# Patient Record
Sex: Male | Born: 1975
Health system: Southern US, Community
[De-identification: ages and names within clinical notes are randomized; demographics above are authoritative.]

## PROBLEM LIST (undated history)

## (undated) DIAGNOSIS — K219 Gastro-esophageal reflux disease without esophagitis: Secondary | ICD-10-CM

## (undated) DIAGNOSIS — S0990XA Unspecified injury of head, initial encounter: Secondary | ICD-10-CM

## (undated) DIAGNOSIS — F1910 Other psychoactive substance abuse, uncomplicated: Secondary | ICD-10-CM

## (undated) DIAGNOSIS — M47817 Spondylosis without myelopathy or radiculopathy, lumbosacral region: Secondary | ICD-10-CM

## (undated) DIAGNOSIS — F1411 Cocaine abuse, in remission: Secondary | ICD-10-CM

## (undated) DIAGNOSIS — G8929 Other chronic pain: Secondary | ICD-10-CM

## (undated) DIAGNOSIS — R51 Headache: Secondary | ICD-10-CM

## (undated) DIAGNOSIS — G43909 Migraine, unspecified, not intractable, without status migrainosus: Secondary | ICD-10-CM

## (undated) DIAGNOSIS — M1990 Unspecified osteoarthritis, unspecified site: Secondary | ICD-10-CM

## (undated) DIAGNOSIS — J309 Allergic rhinitis, unspecified: Secondary | ICD-10-CM

## (undated) DIAGNOSIS — J45909 Unspecified asthma, uncomplicated: Secondary | ICD-10-CM

## (undated) DIAGNOSIS — G5600 Carpal tunnel syndrome, unspecified upper limb: Secondary | ICD-10-CM

## (undated) DIAGNOSIS — M47812 Spondylosis without myelopathy or radiculopathy, cervical region: Secondary | ICD-10-CM

## (undated) DIAGNOSIS — M545 Low back pain, unspecified: Secondary | ICD-10-CM

## (undated) DIAGNOSIS — R519 Headache, unspecified: Secondary | ICD-10-CM

## (undated) DIAGNOSIS — E039 Hypothyroidism, unspecified: Secondary | ICD-10-CM

## (undated) DIAGNOSIS — M542 Cervicalgia: Secondary | ICD-10-CM

## (undated) DIAGNOSIS — S48912A Complete traumatic amputation of left shoulder and upper arm, level unspecified, initial encounter: Secondary | ICD-10-CM

## (undated) DIAGNOSIS — M75100 Unspecified rotator cuff tear or rupture of unspecified shoulder, not specified as traumatic: Secondary | ICD-10-CM

## (undated) HISTORY — DX: Headache, unspecified: R51.9

## (undated) HISTORY — DX: Other psychoactive substance abuse, uncomplicated: F19.10

## (undated) HISTORY — PX: ARM AMPUTATION: SUR21

## (undated) HISTORY — DX: Unspecified osteoarthritis, unspecified site: M19.90

## (undated) HISTORY — PX: PELVIC FRACTURE SURGERY: SHX119

## (undated) HISTORY — DX: Cocaine abuse, in remission: F14.11

## (undated) HISTORY — DX: Spondylosis without myelopathy or radiculopathy, lumbosacral region: M47.817

## (undated) HISTORY — DX: Low back pain, unspecified: M54.50

## (undated) HISTORY — DX: Unspecified asthma, uncomplicated: J45.909

## (undated) HISTORY — PX: OTHER SURGICAL HISTORY: SHX169

## (undated) HISTORY — DX: Migraine, unspecified, not intractable, without status migrainosus: G43.909

## (undated) HISTORY — DX: Allergic rhinitis, unspecified: J30.9

## (undated) HISTORY — DX: Hypothyroidism, unspecified: E03.9

## (undated) HISTORY — DX: Spondylosis without myelopathy or radiculopathy, cervical region: M47.812

## (undated) HISTORY — DX: Other chronic pain: G89.29

## (undated) HISTORY — DX: Headache: R51

## (undated) HISTORY — PX: WISDOM TOOTH EXTRACTION: SHX21

## (undated) HISTORY — DX: Cervicalgia: M54.2

## (undated) HISTORY — DX: Gastro-esophageal reflux disease without esophagitis: K21.9

## (undated) HISTORY — DX: Complete traumatic amputation of left shoulder and upper arm, level unspecified, initial encounter: S48.912A

## (undated) HISTORY — DX: Unspecified injury of head, initial encounter: S09.90XA

## (undated) HISTORY — DX: Low back pain: M54.5

## (undated) HISTORY — DX: Carpal tunnel syndrome, unspecified upper limb: G56.00

## (undated) HISTORY — DX: Unspecified rotator cuff tear or rupture of unspecified shoulder, not specified as traumatic: M75.100

---

## 2000-04-07 DIAGNOSIS — M75100 Unspecified rotator cuff tear or rupture of unspecified shoulder, not specified as traumatic: Secondary | ICD-10-CM

## 2000-04-07 HISTORY — DX: Unspecified rotator cuff tear or rupture of unspecified shoulder, not specified as traumatic: M75.100

## 2005-01-20 ENCOUNTER — Ambulatory Visit: Payer: Self-pay | Admitting: Physician Assistant

## 2007-07-28 ENCOUNTER — Ambulatory Visit: Payer: Self-pay | Admitting: Pain Medicine

## 2007-11-01 ENCOUNTER — Ambulatory Visit: Payer: Self-pay | Admitting: Gastroenterology

## 2010-04-24 ENCOUNTER — Ambulatory Visit: Payer: Self-pay | Admitting: Pain Medicine

## 2012-02-23 ENCOUNTER — Inpatient Hospital Stay: Payer: Self-pay | Admitting: Internal Medicine

## 2012-02-23 LAB — COMPREHENSIVE METABOLIC PANEL
Albumin: 2.8 g/dL — ABNORMAL LOW (ref 3.4–5.0)
Alkaline Phosphatase: 132 U/L (ref 50–136)
BUN: 11 mg/dL (ref 7–18)
Calcium, Total: 9.2 mg/dL (ref 8.5–10.1)
Chloride: 100 mmol/L (ref 98–107)
Co2: 26 mmol/L (ref 21–32)
EGFR (African American): >60
EGFR (Non-African Amer.): >60
Glucose: 129 mg/dL — ABNORMAL HIGH (ref 65–99)
Osmolality: 269 (ref 275–301)
Potassium: 3.4 mmol/L — ABNORMAL LOW (ref 3.5–5.1)
SGPT (ALT): 27 U/L (ref 12–78)
Sodium: 134 mmol/L — ABNORMAL LOW (ref 136–145)

## 2012-02-23 LAB — CBC
HGB: 12.2 g/dL — ABNORMAL LOW (ref 13.0–18.0)
Platelet: 267 10*3/uL (ref 150–440)
RBC: 3.96 10*6/uL — ABNORMAL LOW (ref 4.40–5.90)
WBC: 13.7 10*3/uL — ABNORMAL HIGH (ref 3.8–10.6)

## 2012-02-23 LAB — CK: CK, Total: 57 U/L (ref 35–232)

## 2012-02-25 LAB — COMPREHENSIVE METABOLIC PANEL
Albumin: 2 g/dL — ABNORMAL LOW (ref 3.4–5.0)
Alkaline Phosphatase: 466 U/L — ABNORMAL HIGH (ref 50–136)
Anion Gap: 6 — ABNORMAL LOW (ref 7–16)
Calcium, Total: 8.1 mg/dL — ABNORMAL LOW (ref 8.5–10.1)
Creatinine: 0.82 mg/dL (ref 0.60–1.30)
EGFR (Non-African Amer.): >60
Glucose: 102 mg/dL — ABNORMAL HIGH (ref 65–99)
Potassium: 3.3 mmol/L — ABNORMAL LOW (ref 3.5–5.1)
SGPT (ALT): 23 U/L (ref 12–78)
Sodium: 137 mmol/L (ref 136–145)
Total Protein: 5.7 g/dL — ABNORMAL LOW (ref 6.4–8.2)

## 2012-02-25 LAB — CBC WITH DIFFERENTIAL/PLATELET
Basophil #: 0.1 10*3/uL (ref 0.0–0.1)
Basophil %: 0.5 %
Eosinophil #: 0.3 10*3/uL (ref 0.0–0.7)
Eosinophil %: 2.2 %
HCT: 27.5 % — ABNORMAL LOW (ref 40.0–52.0)
HGB: 9.4 g/dL — ABNORMAL LOW (ref 13.0–18.0)
Lymphocyte #: 1.1 10*3/uL (ref 1.0–3.6)
Lymphocyte %: 9.2 %
MCHC: 34.3 g/dL (ref 32.0–36.0)
Monocyte #: 1.3 x10 3/mm — ABNORMAL HIGH (ref 0.2–1.0)
Neutrophil %: 77.1 %
Platelet: 248 10*3/uL (ref 150–440)
RDW: 14.3 % (ref 11.5–14.5)
WBC: 11.6 10*3/uL — ABNORMAL HIGH (ref 3.8–10.6)

## 2012-02-25 LAB — VANCOMYCIN, TROUGH: Vancomycin, Trough: 14 ug/mL (ref 10–20)

## 2012-02-27 LAB — WOUND CULTURE

## 2012-02-28 LAB — CBC WITH DIFFERENTIAL/PLATELET
Basophil #: 0.1 10*3/uL (ref 0.0–0.1)
Basophil %: 0.7 %
Eosinophil #: 0.3 10*3/uL (ref 0.0–0.7)
HCT: 32.1 % — ABNORMAL LOW (ref 40.0–52.0)
HGB: 10.5 g/dL — ABNORMAL LOW (ref 13.0–18.0)
Lymphocyte #: 1.9 10*3/uL (ref 1.0–3.6)
Lymphocyte %: 12.6 %
MCH: 29.4 pg (ref 26.0–34.0)
MCHC: 32.6 g/dL (ref 32.0–36.0)
MCV: 90 fL (ref 80–100)
Neutrophil #: 11.3 10*3/uL — ABNORMAL HIGH (ref 1.4–6.5)
Neutrophil %: 76.7 %
RDW: 15 % — ABNORMAL HIGH (ref 11.5–14.5)

## 2012-02-28 LAB — BASIC METABOLIC PANEL
Anion Gap: 8 (ref 7–16)
BUN: 10 mg/dL (ref 7–18)
Co2: 26 mmol/L (ref 21–32)
EGFR (Non-African Amer.): >60
Glucose: 120 mg/dL — ABNORMAL HIGH (ref 65–99)
Osmolality: 281 (ref 275–301)

## 2012-02-29 LAB — CBC WITH DIFFERENTIAL/PLATELET
Basophil #: 0.1 10*3/uL (ref 0.0–0.1)
Eosinophil #: 0.3 10*3/uL (ref 0.0–0.7)
Lymphocyte %: 13.3 %
MCHC: 33.3 g/dL (ref 32.0–36.0)
Monocyte %: 5.7 %
Neutrophil #: 11.9 10*3/uL — ABNORMAL HIGH (ref 1.4–6.5)
Neutrophil %: 78 %
RDW: 15.2 % — ABNORMAL HIGH (ref 11.5–14.5)
WBC: 15.3 10*3/uL — ABNORMAL HIGH (ref 3.8–10.6)

## 2012-02-29 LAB — BASIC METABOLIC PANEL
BUN: 7 mg/dL (ref 7–18)
Chloride: 107 mmol/L (ref 98–107)
EGFR (African American): >60
EGFR (Non-African Amer.): >60
Glucose: 90 mg/dL (ref 65–99)
Osmolality: 279 (ref 275–301)
Potassium: 4.3 mmol/L (ref 3.5–5.1)

## 2012-02-29 LAB — CULTURE, BLOOD (SINGLE)

## 2012-08-12 DIAGNOSIS — J302 Other seasonal allergic rhinitis: Secondary | ICD-10-CM | POA: Insufficient documentation

## 2013-02-10 DIAGNOSIS — S76311A Strain of muscle, fascia and tendon of the posterior muscle group at thigh level, right thigh, initial encounter: Secondary | ICD-10-CM | POA: Insufficient documentation

## 2013-02-11 DIAGNOSIS — J45909 Unspecified asthma, uncomplicated: Secondary | ICD-10-CM | POA: Insufficient documentation

## 2013-05-11 DIAGNOSIS — E663 Overweight: Secondary | ICD-10-CM | POA: Insufficient documentation

## 2013-08-07 DIAGNOSIS — R892 Abnormal level of other drugs, medicaments and biological substances in specimens from other organs, systems and tissues: Secondary | ICD-10-CM | POA: Insufficient documentation

## 2013-09-08 ENCOUNTER — Encounter (INDEPENDENT_AMBULATORY_CARE_PROVIDER_SITE_OTHER): Payer: Self-pay

## 2013-09-08 ENCOUNTER — Ambulatory Visit (INDEPENDENT_AMBULATORY_CARE_PROVIDER_SITE_OTHER): Payer: Medicare Other | Admitting: Internal Medicine

## 2013-09-08 ENCOUNTER — Encounter: Payer: Self-pay | Admitting: Internal Medicine

## 2013-09-08 VITALS — BP 128/86 | HR 76 | Temp 98.1°F | Ht 68.0 in | Wt 195.5 lb

## 2013-09-08 DIAGNOSIS — Z9109 Other allergy status, other than to drugs and biological substances: Secondary | ICD-10-CM | POA: Insufficient documentation

## 2013-09-08 DIAGNOSIS — G894 Chronic pain syndrome: Secondary | ICD-10-CM

## 2013-09-08 DIAGNOSIS — K219 Gastro-esophageal reflux disease without esophagitis: Secondary | ICD-10-CM | POA: Insufficient documentation

## 2013-09-08 NOTE — Progress Notes (Signed)
HPI  Pt presents to the clinic today to establish care. He has not had a PCP in many years. He was told by his orthopedic surgeon that he needs to get his oxycodone from his PCP.  Flu: 2/15 Tetanus: 2/15 Dentist: yearly  Past Medical History  Diagnosis Date  . GERD (gastroesophageal reflux disease)   . Asthma   . Arthritis   . Frequent headaches     Current Outpatient Prescriptions  Medication Sig Dispense Refill  . albuterol (PROVENTIL HFA;VENTOLIN HFA) 108 (90 BASE) MCG/ACT inhaler Inhale 2 puffs into the lungs every 6 (six) hours as needed for wheezing or shortness of breath.      . Ascorbic Acid (VITAMIN C) 1000 MG tablet Take 1,000 mg by mouth daily.      Marland Kitchen esomeprazole (NEXIUM) 40 MG capsule Take 40 mg by mouth daily at 12 noon.      . Loratadine 10 MG CAPS Take 1 capsule by mouth daily.      . Oxycodone HCl 20 MG TABS Take 1 tablet by mouth 2 (two) times daily.       No current facility-administered medications for this visit.    No Known Allergies  Family History  Problem Relation Age of Onset  . Arthritis Maternal Grandfather   . Cancer Maternal Grandfather     Colon    History   Social History  . Marital Status: Single    Spouse Name: N/A    Number of Children: N/A  . Years of Education: N/A   Occupational History  . Not on file.   Social History Main Topics  . Smoking status: Never Smoker   . Smokeless tobacco: Never Used  . Alcohol Use: No  . Drug Use: Yes  . Sexual Activity: Not on file   Other Topics Concern  . Not on file   Social History Narrative  . No narrative on file    ROS:  Constitutional: Denies fever, malaise, fatigue, headache or abrupt weight changes.  Respiratory: Denies difficulty breathing, shortness of breath, cough or sputum production.   Cardiovascular: Denies chest pain, chest tightness, palpitations or swelling in the hands or feet.  Gastrointestinal: Pt reports reflux. Denies abdominal pain, bloating, constipation,  diarrhea or blood in the stool.  Musculoskeletal: Pt reports chronic pain. .   No other specific complaints in a complete review of systems (except as listed in HPI above).  PE:  BP 128/86  Pulse 76  Temp(Src) 98.1 F (36.7 C) (Oral)  Ht 5\' 8"  (1.727 m)  Wt 195 lb 8 oz (88.678 kg)  BMI 29.73 kg/m2  SpO2 98% Wt Readings from Last 3 Encounters:  09/08/13 195 lb 8 oz (88.678 kg)    General: Appears his stated age, well developed, well nourished in NAD. Cardiovascular: Normal rate and rhythm. S1,S2 noted.  No murmur, rubs or gallops noted. No JVD or BLE edema. No carotid bruits noted. Pulmonary/Chest: Normal effort and positive vesicular breath sounds. No respiratory distress. No wheezes, rales or ronchi noted.  Abdomen: Soft and nontender. Normal bowel sounds, no bruits noted. No distention or masses noted. Liver, spleen and kidneys non palpable.  Assessment and Plan:

## 2013-09-08 NOTE — Assessment & Plan Note (Signed)
On oxycodone Will refer to pain management

## 2013-09-08 NOTE — Assessment & Plan Note (Signed)
Takes claritin daily

## 2013-09-08 NOTE — Patient Instructions (Addendum)
Chronic Back Pain   When back pain lasts longer than 3 months, it is called chronic back pain.People with chronic back pain often go through certain periods that are more intense (flare-ups).   CAUSES  Chronic back pain can be caused by wear and tear (degeneration) on different structures in your back. These structures include:   The bones of your spine (vertebrae) and the joints surrounding your spinal cord and nerve roots (facets).   The strong, fibrous tissues that connect your vertebrae (ligaments).  Degeneration of these structures may result in pressure on your nerves. This can lead to constant pain.  HOME CARE INSTRUCTIONS   Avoid bending, heavy lifting, prolonged sitting, and activities which make the problem worse.   Take brief periods of rest throughout the day to reduce your pain. Lying down or standing usually is better than sitting while you are resting.   Take over-the-counter or prescription medicines only as directed by your caregiver.  SEEK IMMEDIATE MEDICAL CARE IF:    You have weakness or numbness in one of your legs or feet.   You have trouble controlling your bladder or bowels.   You have nausea, vomiting, abdominal pain, shortness of breath, or fainting.  Document Released: 05/01/2004 Document Revised: 06/16/2011 Document Reviewed: 03/08/2011  Exit[employer] Patient Information 2014 [employer].

## 2013-09-08 NOTE — Assessment & Plan Note (Signed)
No issues on nexium 

## 2013-09-08 NOTE — Progress Notes (Signed)
Pre visit review using our clinic review tool, if applicable. No additional management support is needed unless otherwise documented below in the visit note. 

## 2013-10-03 ENCOUNTER — Telehealth: Payer: Self-pay | Admitting: Internal Medicine

## 2013-10-03 NOTE — Telephone Encounter (Signed)
Patient was referred to [facility].  Patient went to the appointment today and they want him to have surgery.  Patient said he doesn't want to have surgery because he has already had 7 surgeries.  They want him to have 3 surgeries in July and 2 in August.  He said he can't use one arm because of the other surgeries.  Patient [facility] - phone #[phone]. Patient want to hear back as soon as possible.

## 2013-10-04 NOTE — Telephone Encounter (Signed)
This if fine with me, do I need to place another referral?

## 2013-10-04 NOTE — Telephone Encounter (Signed)
Shirlee LimerickMarion said yes that would be helpful if you could place another referral

## 2013-10-05 ENCOUNTER — Other Ambulatory Visit: Payer: Self-pay | Admitting: Internal Medicine

## 2013-10-05 DIAGNOSIS — G894 Chronic pain syndrome: Secondary | ICD-10-CM

## 2013-10-05 NOTE — Telephone Encounter (Signed)
Referral placed.

## 2014-05-12 ENCOUNTER — Other Ambulatory Visit: Payer: Self-pay | Admitting: Internal Medicine

## 2014-05-12 ENCOUNTER — Telehealth: Payer: Self-pay | Admitting: Internal Medicine

## 2014-05-12 DIAGNOSIS — G894 Chronic pain syndrome: Secondary | ICD-10-CM

## 2014-05-12 NOTE — Telephone Encounter (Signed)
Referral placed.

## 2014-05-12 NOTE — Telephone Encounter (Signed)
Pt called wanting to get a referral to [facility] in [place].  [facility]  He stated he was not happy with them.  Pt stated he has [employer]n and medicaid.
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
## 2014-07-25 NOTE — Discharge Summary (Signed)
PATIENT NAME:  Melvin Brown, Melvin Brown DATE OF BIRTH:  11/11/1975  DATE OF ADMISSION:  02/23/2012 DATE OF DISCHARGE:  02/29/2012  ADMITTING PHYSICIAN: Marlaine HindAmir Darwish, MD  DISCHARGING PHYSICIAN: Enid Baasadhika Verlinda Slotnick, MD   [facility]  CONSULTATIONS IN THE HOSPITAL: Surgical consultations by Dr. Juliann PulseLundquist and Dr. Michela PitcherEly.   DISCHARGE DIAGNOSES:  1. Left leg cellulitis abscess and abscess on the upper calf area and popliteal fossa, status post incision and drainage and drains put in.  2. Gastroesophageal reflux disease.  3. Hypothyroidism.  4. Chronic obstructive pulmonary disease.  5. Depression and anxiety.   DISCHARGE MEDICATIONS:  1. Singulair 10 mg p.o. daily.  2. Qvar 18 mcg, one spray daily.  3. Vitamin D one tablet  p.o. daily.  4. Flovent 110 mcg inhalation, one puff inhaled as needed.  5. Oxycodone 10 mg every six hours every day.  6. Xanax 0.25 mg at bedtime p.r.n.  7. Diclofenac sodium 75 mg p.o. daily.  8. Vitamin B12, 2500 mcg sublingual tablet once a day.  9. Cymbalta 60 mg p.o. daily.  10. Allegra 180 mg p.o. daily.  11. Levothyroxine 25 mcg p.o. daily.  12. Omeprazole 20 mg p.o. daily.  13. Norco 5/325 mg, one tablet every four hours p.r.n. for pain.  14. Bactrim double strength one tablet p.o. b.i.d. for two weeks.   DRESSING CARE: Replace as needed. Keep dressing dry and may shower. .   FOLLOW-UP INSTRUCTIONS:  1. Follow up with primary care physician in two weeks.  2. Keep left leg elevated.  3. Follow up with Dr. Michela PitcherEly in one week.   LABORATORY, DIAGNOSTIC AND RADIOLOGICAL DATA PRIOR TO DISCHARGE: WBC 15.3, hemoglobin 10.8, hematocrit 32.3, platelet count 489. Sodium 141, potassium 4.3, chloride 107, bicarbonate 26, BUN 7, creatinine 1.1, glucose 19, calcium 8.5. Wound cultures are growing moderate growth of staph aureus. They were sensitive to all antibiotics, so it is methicillin-sensitive Staphylococcus aureus. This is from  02/23/2012. Wound cultures are growing staph aureus from 02/27/2012, but the sensitivities are pending at this time. CT of the tibia and leg showed consistent with cellulitis and phlegmonous changes within the leg at the level of the popliteal fossa medially appreciated. No drainable fluid collection. Questionable necrotizing infectious process cannot be excluded, and muscle edema within the lower extremities seen.  CT of the femur and thigh kind of shows the same findings as mentioned above. Ultrasound Doppler of the lower extremities showed no evidence of deep venous thrombosis. Blood cultures have remained negative.   BRIEF HOSPITAL COURSE: Melvin Brown is a 39 year old male with past medical history significant for depression, anxiety, gastroesophageal reflux disease, chronic right arm pain, presented to the hospital secondary to left leg abscess and cellulitis, not improving. He was not on any outpatient antibiotics. He had I and D done in the ED and was discharged home. He did not take any antibiotics but continued to have fevers, chills, and worsening of the erythema at that site, so he  presented back.    1. Left leg cellulitis and popliteal fossa abscess: Status post I and D in the ED once and then repeat I and D done by Surgical team on 02/27/2012. He has four drains placed, and his erythema and swelling has improved a lot. His cultures are growing MSSA. He was on oxacillin in the hospital and is being discharged on Bactrim. Pain was controlled by IV medications in the hospital and changed to oral medications at the time  of discharge. He should be seeing Dr. Michela Pitcher in the office in the next week to take the drains out.  How to dress the wound was shown to the patient, and he was advised to keep it dry and change the dressing as needed. Surgical consultation is appreciated.  2. Acute renal failure on admission: Likely from infection, with IV fluids improved. Creatinine is normalized.  3. Gastroesophageal  reflux disease: Started on Prilosec.  4. Hypothyroidism: He is on Synthroid.  5. Depression and anxiety: Continue Cymbalta and Xanax p.r.n.  His course has been otherwise uneventful in the hospital.   DISCHARGE CONDITION: Stable.   DISCHARGE DISPOSITION: Home.   TIME SPENT ON DISCHARGE: 45 minutes.   ____________________________ Enid Baas, MD rk:cbb D: 02/29/2012 12:23:43 ET T: 03/01/2012 11:50:39 ET JOB#: [id number]  cc: Enid Baas, MD, <Dictator> [facility] Enid Baas MD ELECTRONICALLY SIGNED 03/04/2012 18:00

## 2014-07-25 NOTE — Op Note (Signed)
PATIENT NAME:  [name], Melvin A MR#:  Brown DATE OF BIRTH:  09-14-1975  DATE OF PROCEDURE:  02/27/2012  PREOPERATIVE DIAGNOSIS: Right leg infection.   POSTOPERATIVE DIAGNOSIS: Right leg infection.  OPERATION: Right leg incision and drainage.  SURGEON: Tiney Rougealph Ely, MD  ANESTHESIA: General.  OPERATIVE PROCEDURE: With the patient in the supine position after induction of appropriate general anesthesia, the patient's right leg was prepped with Betadine and draped with sterile towels. The area of maximum fluctuance was incised and a small amount of purulent material was removed, but probing that area entered a large abscess cavity. Multiple counter incisions were made including the area where it was previously draining. Penrose drain was placed through multiple counter incisions and sutured in place with 3-0 nylon. The area was then irrigated with a large amount of warm saline. Sterile compressive dressing was applied. The patient was returned to the recovery room having tolerated the procedure well. Sponge, instrument, and needle counts were correct x2 in the Operating Room.  ____________________________ Quentin Orealph L. Ely III, MD rle:slb D: 02/27/2012 22:50:30 ET T: 02/28/2012 11:22:34 ET JOB#: [id number]  cc: Quentin Orealph L. Ely III, MD, <Dictator> Quentin OreALPH L ELY MD ELECTRONICALLY SIGNED 02/28/2012 21:30

## 2014-10-12 ENCOUNTER — Encounter: Payer: Self-pay | Admitting: Family Medicine

## 2014-10-12 ENCOUNTER — Ambulatory Visit (INDEPENDENT_AMBULATORY_CARE_PROVIDER_SITE_OTHER): Payer: Medicare Other | Admitting: Family Medicine

## 2014-10-12 VITALS — BP 122/84 | HR 66 | Temp 98.7°F | Ht 67.5 in | Wt 218.2 lb

## 2014-10-12 DIAGNOSIS — M25511 Pain in right shoulder: Secondary | ICD-10-CM | POA: Diagnosis not present

## 2014-10-12 DIAGNOSIS — G894 Chronic pain syndrome: Secondary | ICD-10-CM | POA: Insufficient documentation

## 2014-10-12 DIAGNOSIS — Z13220 Encounter for screening for lipoid disorders: Secondary | ICD-10-CM

## 2014-10-12 DIAGNOSIS — R609 Edema, unspecified: Secondary | ICD-10-CM | POA: Diagnosis not present

## 2014-10-12 DIAGNOSIS — M67919 Unspecified disorder of synovium and tendon, unspecified shoulder: Secondary | ICD-10-CM | POA: Insufficient documentation

## 2014-10-12 LAB — MICROALBUMIN, URINE WAIVED
Creatinine, Urine Waived: 50 mg/dL (ref 10–300)
Microalb, Ur Waived: 10 mg/L (ref 0–19)
Microalb/Creat Ratio: <30 mg/g (ref <30)

## 2014-10-12 NOTE — Progress Notes (Signed)
BP 122/84 mmHg  Pulse 66  Temp(Src) 98.7 F (37.1 C)  Ht 5' 7.5" (1.715 m)  Wt 218 lb 3.2 oz (98.975 kg)  BMI 33.65 kg/m2  SpO2 97%   Subjective:    Patient ID: Melvin Brown, male    DOB: 07-07-1975, 39 y.o.   MRN: [id number]  HPI: BRANDONN Brown is a 39 y.o. male who presents today to establish care. Hasn't seen a doctor in about a year due to a move making it too far to drive.   Chief Complaint  Patient presents with  . Edema    ankle swelling  . Shoulder Pain    right- torn rotator cuff   SHOULDER PAIN- had been diagnosed with a torn rotator cuff. Hasn't done PT for it yet.  Duration: months Involved shoulder: right Mechanism of injury: unknown lifting Location: anterior Onset:sudden Severity: moderate  Quality:  sharp Frequency: constant Radiation: yes into his neck Aggravating factors: lifting  Alleviating factors: ice and rest  Status: worse Treatments attempted: rest, ice, heat, sling, APAP, ibuprofen and aleve  Relief with NSAIDs?:  mild Weakness: yes Numbness: yes Decreased grip strength: yes Redness: no Swelling: no Bruising: no Fevers: no  Edema- have been swelling in his ankles for about 2 weeks. Never swollen before. Worse at night. Has been swollen most of the day. Not there in the morning.   Elevate Blood pressure- never had before. His grandfather had it Duration of elevated blood pressure: unknown BP monitoring frequency:  not checking Aspirin: no Recurrent headaches: yes Visual changes: no Palpitations: no Dyspnea: yes Chest pain: no Lower extremity edema: yes Dizzy/lightheaded: no  Relevant past medical, surgical, family and social history reviewed and updated as indicated. Interim medical history since our last visit reviewed. Allergies and medications reviewed and updated.  Review of Systems  Constitutional: Negative.   Respiratory: Negative.   Cardiovascular: Negative.   Musculoskeletal: Negative.   Neurological: Negative.    Psychiatric/Behavioral: Negative.     Per HPI unless specifically indicated above     Objective:    BP 122/84 mmHg  Pulse 66  Temp(Src) 98.7 F (37.1 C)  Ht 5' 7.5" (1.715 m)  Wt 218 lb 3.2 oz (98.975 kg)  BMI 33.65 kg/m2  SpO2 97%  Wt Readings from Last 3 Encounters:  10/12/14 218 lb 3.2 oz (98.975 kg)  09/08/13 195 lb 8 oz (88.678 kg)    Physical Exam  Constitutional: He is oriented to person, place, and time. He appears well-developed and well-nourished. No distress.  HENT:  Head: Normocephalic and atraumatic.  Right Ear: Hearing normal.  Left Ear: Hearing normal.  Nose: Nose normal.  Eyes: Conjunctivae and lids are normal. Right eye exhibits no discharge. Left eye exhibits no discharge. No scleral icterus.  Cardiovascular: Normal rate and regular rhythm.  Exam reveals no gallop and no friction rub.   No murmur heard. Pulmonary/Chest: Effort normal. No respiratory distress. He has no wheezes. He has no rales. He exhibits no tenderness.  Musculoskeletal: He exhibits edema.  L arm amputated  trace edema bilaterally, negative squeeze, negative Homan's sign  Neurological: He is alert and oriented to person, place, and time.  Skin: Skin is warm and intact. No rash noted. No erythema. No pallor.  Psychiatric: He has a normal mood and affect. His speech is normal and behavior is normal. Judgment and thought content normal. Cognition and memory are normal.     Shoulder: right    Inspection:  no swelling, ecchymosis, erythema  or step off deformity.   Tenderness to Palpation:    Acromion: yes    AC joint:no    Clavicle: no    Bicipital groove: yes    Scapular spine: no    Coracoid process: no    Humeral head: yes    Supraspinatus tendon: yes    Range of Motion:      Abduction:Decreased     Adduction: Normal     Flexion: Decreased     Extension: Normal     Internal rotation: Decreased     External rotation: Decreased     Painful arc: yes    Muscle Strength: 5/5  bilaterally    Neuro: Sensation WNL. and Upper extremity reflexes WNL.    Special Tests:     Neer sign: Negative    Hawkins sign: Negative    Cross arm adduction: Negative    Speed sign: Negative      Assessment & Plan:   Problem List Items Addressed This Visit      Other   Shoulder pain, acute - Primary    Patient states that he has a known rotator cuff tear and has [facility]. He has left the practice, but he would like to get back in to see them again. Referral generated today.       Relevant Orders   Ambulatory referral to Orthopedic Surgery   Peripheral edema    New over the past 2 weeks. No sign of DVT, trace edema. Lungs and heart sound normal. Will check labwork today including TSH, CBC, CMP and microalbumin given the elevated BP prior to recheck. Likely due to the head and his previous surgery resulting in weaker veins. Await results. Compression stockings recommended. Elevation when able to. Call if not getting better or getting worse. Continue to monitor.       Relevant Orders   CBC With Differential/Platelet   Comprehensive metabolic panel   Microalbumin, Urine Waived   TSH    Other Visit Diagnoses    Screening for cholesterol level        Will check levels and follow up pending results.     Relevant Orders    Lipid Panel w/o Chol/HDL Ratio        Follow up plan: Return 2-3 months PE.

## 2014-10-12 NOTE — Assessment & Plan Note (Signed)
Patient states that he has a known rotator cuff tear and has [facility]. He has left the practice, but he would like to get back in to see them again. Referral generated today.

## 2014-10-12 NOTE — Assessment & Plan Note (Addendum)
New over the past 2 weeks. No sign of DVT, trace edema. Lungs and heart sound normal. Will check labwork today including TSH, CBC, CMP and microalbumin given the elevated BP prior to recheck. Likely due to the head and his previous surgery resulting in weaker veins. Await results. Compression stockings recommended. Elevation when able to. Call if not getting better or getting worse. Continue to monitor.

## 2014-10-12 NOTE — Patient Instructions (Signed)
Shoulder Pain  The shoulder is the joint that connects your arm to your body. Muscles and band-like tissues that connect bones to muscles (tendons) hold the joint together. Shoulder pain is felt if an injury or medical problem affects one or more parts of the shoulder.  HOME CARE   · Put ice on the sore area.  ¨ Put ice in a plastic bag.  ¨ Place a towel between your skin and the bag.  ¨ Leave the ice on for 15-20 minutes, 03-04 times a day for the first 2 days.  · Stop using cold packs if they do not help with the pain.  · If you were given something to keep your shoulder from moving (sling; shoulder immobilizer), wear it as told. Only take it off to shower or bathe.  · Move your arm as little as possible, but keep your hand moving to prevent puffiness (swelling).  · Squeeze a soft ball or foam pad as much as possible to help prevent swelling.  · Take medicine as told by your doctor.  GET HELP IF:  · You have progressing new pain in your arm, hand, or fingers.  · Your hand or fingers get cold.  · Your medicine does not help lessen your pain.  GET HELP RIGHT AWAY IF:   · Your arm, hand, or fingers are numb or tingling.  · Your arm, hand, or fingers are puffy (swollen), painful, or turn white or blue.  MAKE SURE YOU:   · Understand these instructions.  · Will watch your condition.  · Will get help right away if you are not doing well or get worse.  Document Released: 09/10/2007 Document Revised: 08/08/2013 Document Reviewed: 10/06/2011  [employer]® Patient Information ©2015 [employer]. This information is not intended to replace advice given to you by your health care provider. Make sure you discuss any questions you have with your health care provider.

## 2014-10-13 ENCOUNTER — Telehealth: Payer: Self-pay | Admitting: Family Medicine

## 2014-10-13 DIAGNOSIS — R748 Abnormal levels of other serum enzymes: Secondary | ICD-10-CM

## 2014-10-13 DIAGNOSIS — E039 Hypothyroidism, unspecified: Secondary | ICD-10-CM | POA: Insufficient documentation

## 2014-10-13 LAB — SPECIMEN STATUS REPORT

## 2014-10-13 MED ORDER — LEVOTHYROXINE SODIUM 25 MCG PO TABS: 25.0000 ug | ORAL_TABLET | Freq: Every day | ORAL | Status: DC

## 2014-10-13 NOTE — Telephone Encounter (Signed)
Called patient and told him that his liver functions were elevated. Avoid tylenol. Avoid ETOH. Will recheck in 6 weeks with thyroid. Thyroid elevated. Was on meds previously. Will start him on low dose levothyroxine. Sent to his pharmacy. Follow up with blood work in 6 weeks. Please book him appt for labs in 6 weeks, orders in.

## 2014-10-13 NOTE — Telephone Encounter (Signed)
Patient already has an appointment scheduled for 11/23/14 for a CPE, will have it rechecked then.

## 2014-10-25 LAB — COMPREHENSIVE METABOLIC PANEL
ALT: 114 IU/L — ABNORMAL HIGH (ref 0–44)
AST: 139 IU/L — ABNORMAL HIGH (ref 0–40)
Albumin/Globulin Ratio: 1.6 (ref 1.1–2.5)
Albumin: 4.4 g/dL (ref 3.5–5.5)
Alkaline Phosphatase: 63 IU/L (ref 39–117)
BUN / CREAT RATIO: 9 (ref 8–19)
BUN: 8 mg/dL (ref 6–20)
Bilirubin Total: 1 mg/dL (ref 0.0–1.2)
CALCIUM: 8.8 mg/dL (ref 8.7–10.2)
CO2: 24 mmol/L (ref 18–29)
Chloride: 101 mmol/L (ref 97–108)
Creatinine, Ser: 0.92 mg/dL (ref 0.76–1.27)
GFR calc Af Amer: 122 mL/min/{1.73_m2} (ref >59)
GFR calc non Af Amer: 105 mL/min/{1.73_m2} (ref >59)
GLOBULIN, TOTAL: 2.8 g/dL (ref 1.5–4.5)
Glucose: 94 mg/dL (ref 65–99)
Potassium: 4.3 mmol/L (ref 3.5–5.2)
Sodium: 142 mmol/L (ref 134–144)
TOTAL PROTEIN: 7.2 g/dL (ref 6.0–8.5)

## 2014-10-25 LAB — TSH: TSH: 8.33 u[IU]/mL — ABNORMAL HIGH (ref 0.450–4.500)

## 2014-10-25 LAB — LIPID PANEL W/O CHOL/HDL RATIO
Cholesterol, Total: 153 mg/dL (ref 100–199)
HDL: 55 mg/dL (ref >39)
LDL Calculated: 85 mg/dL (ref 0–99)
TRIGLYCERIDES: 67 mg/dL (ref 0–149)
VLDL Cholesterol Cal: 13 mg/dL (ref 5–40)

## 2014-11-10 ENCOUNTER — Telehealth: Payer: Self-pay

## 2014-11-10 DIAGNOSIS — M7581 Other shoulder lesions, right shoulder: Secondary | ICD-10-CM | POA: Insufficient documentation

## 2014-11-10 DIAGNOSIS — G8929 Other chronic pain: Secondary | ICD-10-CM

## 2014-11-10 NOTE — Telephone Encounter (Signed)
He would like a referral to go to the pain clinic. He went to ortho but they did not give him anything for his pain.

## 2014-11-10 NOTE — Telephone Encounter (Signed)
That is fine. Referral generated today.

## 2014-11-14 ENCOUNTER — Telehealth: Payer: Self-pay | Admitting: Family Medicine

## 2014-11-14 DIAGNOSIS — M542 Cervicalgia: Secondary | ICD-10-CM

## 2014-11-14 DIAGNOSIS — M549 Dorsalgia, unspecified: Principal | ICD-10-CM

## 2014-11-14 DIAGNOSIS — G8929 Other chronic pain: Secondary | ICD-10-CM

## 2014-11-14 NOTE — Telephone Encounter (Signed)
Patient would like to be referred to [name], he is going to call you and give you the name of it. Dr.Johnson approved him to go to [place].

## 2014-11-14 NOTE — Telephone Encounter (Signed)
Called cell phone, no answer unable to leave message, will try again.

## 2014-11-15 NOTE — Telephone Encounter (Signed)
Spoke with patient, he does what to see them for chronic pain.

## 2014-11-15 NOTE — Telephone Encounter (Signed)
I'm not sure if they do chronic pain, but if it's neurosurgery, we can send him there. Not for torn rotator cuff though.

## 2014-11-15 NOTE — Telephone Encounter (Signed)
Patient called, he is wanting to be referred to [facility] for a torn rotator cuff? Is this appropriate, if so can he be referred here?

## 2014-11-23 ENCOUNTER — Encounter: Payer: Medicare Other | Admitting: Family Medicine

## 2014-12-11 DIAGNOSIS — F1120 Opioid dependence, uncomplicated: Secondary | ICD-10-CM | POA: Diagnosis not present

## 2014-12-27 DIAGNOSIS — F1120 Opioid dependence, uncomplicated: Secondary | ICD-10-CM | POA: Diagnosis not present

## 2015-01-10 DIAGNOSIS — F1120 Opioid dependence, uncomplicated: Secondary | ICD-10-CM | POA: Diagnosis not present

## 2015-01-12 DIAGNOSIS — R202 Paresthesia of skin: Secondary | ICD-10-CM | POA: Diagnosis not present

## 2015-01-12 DIAGNOSIS — R200 Anesthesia of skin: Secondary | ICD-10-CM | POA: Diagnosis not present

## 2015-01-12 DIAGNOSIS — Z89222 Acquired absence of left upper limb above elbow: Secondary | ICD-10-CM | POA: Diagnosis not present

## 2015-01-22 DIAGNOSIS — M79601 Pain in right arm: Secondary | ICD-10-CM | POA: Diagnosis not present

## 2015-01-22 DIAGNOSIS — S52301A Unspecified fracture of shaft of right radius, initial encounter for closed fracture: Secondary | ICD-10-CM | POA: Diagnosis not present

## 2015-01-22 DIAGNOSIS — M79641 Pain in right hand: Secondary | ICD-10-CM | POA: Diagnosis not present

## 2015-01-22 DIAGNOSIS — Z9889 Other specified postprocedural states: Secondary | ICD-10-CM | POA: Diagnosis not present

## 2015-01-22 DIAGNOSIS — E039 Hypothyroidism, unspecified: Secondary | ICD-10-CM | POA: Diagnosis not present

## 2015-01-22 DIAGNOSIS — M1811 Unilateral primary osteoarthritis of first carpometacarpal joint, right hand: Secondary | ICD-10-CM | POA: Diagnosis not present

## 2015-01-22 DIAGNOSIS — M7989 Other specified soft tissue disorders: Secondary | ICD-10-CM | POA: Diagnosis not present

## 2015-01-22 DIAGNOSIS — R7982 Elevated C-reactive protein (CRP): Secondary | ICD-10-CM | POA: Diagnosis not present

## 2015-01-22 DIAGNOSIS — M79631 Pain in right forearm: Secondary | ICD-10-CM | POA: Diagnosis not present

## 2015-01-22 DIAGNOSIS — M19041 Primary osteoarthritis, right hand: Secondary | ICD-10-CM | POA: Diagnosis not present

## 2015-01-24 DIAGNOSIS — F1120 Opioid dependence, uncomplicated: Secondary | ICD-10-CM | POA: Diagnosis not present

## 2015-01-30 DIAGNOSIS — G5601 Carpal tunnel syndrome, right upper limb: Secondary | ICD-10-CM | POA: Diagnosis not present

## 2015-02-04 ENCOUNTER — Encounter: Payer: Self-pay | Admitting: Emergency Medicine

## 2015-02-04 ENCOUNTER — Emergency Department
Admission: EM | Admit: 2015-02-04 | Discharge: 2015-02-05 | Disposition: A | Discharge status: Home / Self Care | Payer: Medicare Other | Attending: Emergency Medicine | Admitting: Emergency Medicine

## 2015-02-04 ENCOUNTER — Emergency Department: Payer: Medicare Other

## 2015-02-04 DIAGNOSIS — Y908 Blood alcohol level of 240 mg/100 ml or more: Secondary | ICD-10-CM | POA: Diagnosis not present

## 2015-02-04 DIAGNOSIS — Y9389 Activity, other specified: Secondary | ICD-10-CM | POA: Diagnosis not present

## 2015-02-04 DIAGNOSIS — Z89202 Acquired absence of left upper limb, unspecified level: Secondary | ICD-10-CM | POA: Diagnosis not present

## 2015-02-04 DIAGNOSIS — Y9289 Other specified places as the place of occurrence of the external cause: Secondary | ICD-10-CM | POA: Insufficient documentation

## 2015-02-04 DIAGNOSIS — F10929 Alcohol use, unspecified with intoxication, unspecified: Secondary | ICD-10-CM

## 2015-02-04 DIAGNOSIS — F10129 Alcohol abuse with intoxication, unspecified: Secondary | ICD-10-CM | POA: Insufficient documentation

## 2015-02-04 DIAGNOSIS — F1110 Opioid abuse, uncomplicated: Secondary | ICD-10-CM | POA: Diagnosis not present

## 2015-02-04 DIAGNOSIS — Y998 Other external cause status: Secondary | ICD-10-CM | POA: Insufficient documentation

## 2015-02-04 DIAGNOSIS — T507X1A Poisoning by analeptics and opioid receptor antagonists, accidental (unintentional), initial encounter: Secondary | ICD-10-CM | POA: Diagnosis present

## 2015-02-04 DIAGNOSIS — J80 Acute respiratory distress syndrome: Secondary | ICD-10-CM | POA: Diagnosis not present

## 2015-02-04 DIAGNOSIS — F1910 Other psychoactive substance abuse, uncomplicated: Secondary | ICD-10-CM | POA: Diagnosis not present

## 2015-02-04 DIAGNOSIS — R05 Cough: Secondary | ICD-10-CM | POA: Diagnosis not present

## 2015-02-04 LAB — CBC WITH DIFFERENTIAL/PLATELET
BASOS ABS: 0.1 10*3/uL (ref 0–0.1)
BASOS PCT: 1 %
EOS ABS: 0.2 10*3/uL (ref 0–0.7)
EOS PCT: 3 %
HCT: 43.7 % (ref 40.0–52.0)
Hemoglobin: 15 g/dL (ref 13.0–18.0)
Lymphocytes Relative: 39 %
Lymphs Abs: 2.9 10*3/uL (ref 1.0–3.6)
MCH: 32.5 pg (ref 26.0–34.0)
MCHC: 34.4 g/dL (ref 32.0–36.0)
MCV: 94.4 fL (ref 80.0–100.0)
MONO ABS: 0.8 10*3/uL (ref 0.2–1.0)
Monocytes Relative: 10 %
Neutro Abs: 3.5 10*3/uL (ref 1.4–6.5)
Neutrophils Relative %: 47 %
PLATELETS: 176 10*3/uL (ref 150–440)
RBC: 4.63 MIL/uL (ref 4.40–5.90)
RDW: 14.4 % (ref 11.5–14.5)
WBC: 7.4 10*3/uL (ref 3.8–10.6)

## 2015-02-04 LAB — URINALYSIS COMPLETE WITH MICROSCOPIC (ARMC ONLY)
BILIRUBIN URINE: NEGATIVE
Bacteria, UA: NONE SEEN
Glucose, UA: NEGATIVE mg/dL
HGB URINE DIPSTICK: NEGATIVE
Ketones, ur: NEGATIVE mg/dL
LEUKOCYTES UA: NEGATIVE
Nitrite: NEGATIVE
PH: 6 (ref 5.0–8.0)
Protein, ur: NEGATIVE mg/dL
RBC / HPF: NONE SEEN RBC/hpf (ref 0–5)
Specific Gravity, Urine: 1.008 (ref 1.005–1.030)

## 2015-02-04 LAB — COMPREHENSIVE METABOLIC PANEL
ALBUMIN: 3.9 g/dL (ref 3.5–5.0)
ALT: 87 U/L — ABNORMAL HIGH (ref 17–63)
AST: 119 U/L — AB (ref 15–41)
Alkaline Phosphatase: 79 U/L (ref 38–126)
Anion gap: 11 (ref 5–15)
BUN: 8 mg/dL (ref 6–20)
CHLORIDE: 106 mmol/L (ref 101–111)
CO2: 25 mmol/L (ref 22–32)
Calcium: 8.5 mg/dL — ABNORMAL LOW (ref 8.9–10.3)
Creatinine, Ser: 0.87 mg/dL (ref 0.61–1.24)
GFR calc Af Amer: >60 mL/min (ref >60)
Glucose, Bld: 102 mg/dL — ABNORMAL HIGH (ref 65–99)
POTASSIUM: 3.4 mmol/L — AB (ref 3.5–5.1)
SODIUM: 142 mmol/L (ref 135–145)
Total Bilirubin: 0.9 mg/dL (ref 0.3–1.2)
Total Protein: 7.8 g/dL (ref 6.5–8.1)

## 2015-02-04 LAB — URINE DRUG SCREEN, QUALITATIVE (ARMC ONLY)
Amphetamines, Ur Screen: NOT DETECTED
BARBITURATES, UR SCREEN: NOT DETECTED
BENZODIAZEPINE, UR SCRN: NOT DETECTED
Cannabinoid 50 Ng, Ur ~~LOC~~: NOT DETECTED
Cocaine Metabolite,Ur ~~LOC~~: NOT DETECTED
MDMA (Ecstasy)Ur Screen: NOT DETECTED
Methadone Scn, Ur: NOT DETECTED
OPIATE, UR SCREEN: POSITIVE — AB
PHENCYCLIDINE (PCP) UR S: NOT DETECTED
Tricyclic, Ur Screen: NOT DETECTED

## 2015-02-04 LAB — TROPONIN I

## 2015-02-04 LAB — ETHANOL: Alcohol, Ethyl (B): 341 mg/dL (ref <5)

## 2015-02-04 MED ORDER — SODIUM CHLORIDE 0.9 % IV SOLN
1000.0000 mL | Freq: Once | INTRAVENOUS | Status: AC
  Administered 2015-02-04: 1000 mL via INTRAVENOUS

## 2015-02-04 NOTE — ED Notes (Signed)
Per MD assessment pt is to be IVC 'd pts "fiance" states she does not want this for him and refuses to leave. Dr. Mayford KnifeWilliams and I have spoke to him and her multiple times as to the medical reason the pt is needing to stay in the emergency department for stabilization. Pt came to the ED very violent and intoxicated and clothing and belongings were removed from pt to assess him better. Belongings were given to the Fiance to take home.  Fiance had to be removed from the room because she was being very rude and causing the pt to become more irritated. She was asked by myself, Dr. Mayford KnifeWilliams and the pt relations personal to calm down and leave so we can better care for him and that the MD has changed his status to suicidal and we are going to be moving him to a different room for behavioral health pt.

## 2015-02-04 NOTE — ED Notes (Signed)
Pt bib EMS w/ c/o OD.  Pt sts that he took 3-4 oxtcodone.  Fire department on scene gave 2 mg nasal Narcan.  EMS on scene gave 2 mg Narcan IV.  Pt became violent w/ 2nd dose of Narcan.  Pt in O2 sat low and put on NRB.  Pt is alert to voice but violent on arrival. Pt denies SI.

## 2015-02-04 NOTE — ED Provider Notes (Addendum)
[facility] Emergency Department Provider Note     Time seen: ----------------------------------------- 8:35 PM on 02/04/2015 -----------------------------------------  L5 caveat: Review of systems and history is difficult to obtain due to intoxication  I have reviewed the triage vital signs and the nursing notes.   HISTORY  Chief Complaint No chief complaint on file.    HPI Melvin HolmesRobert A Brown is a 39 y.o. male brought to theemergency Departmentafter possible overdose. Patient reports taking a narcotic pain medicine prior to arrival. Patient is had about 4 mg of Narcan which improved his respiratory rate. According to report his respiratory rate was right low prior to this. He is now awake and combative, oxygen saturations were noted to be low also. Patient admits to drinking alcohol but otherwise denies other ingestions.  Past Medical History  Diagnosis Date  . GERD (gastroesophageal reflux disease)   . Asthma   . Arthritis   . Frequent headaches   . Torn rotator cuff 2002    right    Patient Active Problem List   Diagnosis Date Noted  . Hypothyroidism 10/13/2014  . Elevated liver enzymes 10/13/2014  . Shoulder pain, acute 10/12/2014  . Peripheral edema 10/12/2014  . Chronic pain associated with significant psychosocial dysfunction 10/12/2014  . Disorder of rotator cuff 10/12/2014  . Chronic pain syndrome 09/08/2013  . Environmental allergies 09/08/2013  . GERD (gastroesophageal reflux disease) 09/08/2013  . Abnormal toxicological findings 08/07/2013  . Overweight 05/11/2013  . Asthma, mild intermittent 02/11/2013  . Hamstring muscle strain 02/10/2013  . Allergic rhinitis, seasonal 08/12/2012  . Gastro-esophageal reflux disease without esophagitis 04/16/2011    Past Surgical History  Procedure Laterality Date  . Arm amputation Left   . Pelvic fracture surgery    . Arm reconstruction Right     X 5    Allergies Review of patient's  allergies indicates no known allergies.  Social History Social History  Substance Use Topics  . Smoking status: Never Smoker   . Smokeless tobacco: Never Used  . Alcohol Use: No    Review of Systems  10-point ROS is unknown at this time  ____________________________________________   PHYSICAL EXAM:  VITAL SIGNS: ED Triage Vitals  Enc Vitals Group     BP --      Pulse --      Resp --      Temp --      Temp src --      SpO2 --      Weight --      Height --      Head Cir --      Peak Flow --      Pain Score --      Pain Loc --      Pain Edu? --      Excl. in GC? --     Constitutional: Alert butdisoriented. Well appearing and in mild distress Eyes: Conjunctivae are normal. PERRL. Normal extraocular movements. ENT   Head: Normocephalic and atraumatic.   Nose: No congestion/rhinnorhea.   Mouth/Throat: Mucous membranes are moist.   Neck: No stridor. Cardiovascular: Normal rate, regular rhythm. Normal and symmetric distal pulses are present in all extremities. No murmurs, rubs, or gallops. Respiratory: mild tachypnea with rhonchi bilaterally. No wheezes/rales/rhonchi. Gastrointestinal: Soft and nontender. No distention. No abdominal bruits.  Musculoskeletal: left upper extremity amputation, otherwise normal extremity exam. Neurologic: patient with slurred speech, appears to move his extremities well. Skin:  Skin is warm, dry and intact. No rash noted. Psychiatric:  patient appears intoxicated at this time.  ____________________________________________  ED COURSE:  Pertinent labs & imaging results that were available during my care of the patient were reviewed by me and considered in my medical decision making (see chart for details). Patient with unknown ingestions, will check labs and tox screen. Will also need chest x-ray. ____________________________________________    LABS (pertinent positives/negatives)  Labs Reviewed  COMPREHENSIVE METABOLIC  PANEL - Abnormal; Notable for the following:    Potassium 3.4 (*)    Glucose, Bld 102 (*)    Calcium 8.5 (*)    AST 119 (*)    ALT 87 (*)    All other components within normal limits  URINALYSIS COMPLETEWITH MICROSCOPIC ([facility] ONLY) - Abnormal; Notable for the following:    Color, Urine YELLOW (*)    APPearance CLEAR (*)    Squamous Epithelial / LPF 0-5 (*)    All other components within normal limits  URINE DRUG SCREEN, QUALITATIVE ([facility] ONLY) - Abnormal; Notable for the following:    Opiate, Ur Screen POSITIVE (*)    All other components within normal limits  ETHANOL - Abnormal; Notable for the following:    Alcohol, Ethyl (B) 341 (*)    All other components within normal limits  CBC WITH DIFFERENTIAL/PLATELET  TROPONIN I    RADIOLOGY Images were viewed by me  Chest x-ray  ____________________________________________  FINAL ASSESSMENT AND PLAN  Overdose, intoxication  Plan: Patient with labs and imaging as dictated above. Patient is a threat to himself, potentially overdosed on himself today. Patient states he took the medication which was 7 Percocets in order to kill the pain. Patient was already heavily intoxicated with alcohol at that time. I will involuntarily commit him.   Emily Filbert, MD  The wife is unhappy with my decision to commit him, I have advised that he is not making wise decisions, overdosed on medications while grossly intoxicated. He will need to sober and speak with psychiatry before I feel comfortable with discharging him. Emily Filbert, MD 02/04/15 2130  Emily Filbert, MD 02/04/15 [id number]  Emily Filbert, MD 02/04/15 [phone]

## 2015-02-04 NOTE — [facility] Counselor (Signed)
Pt not yet medically cleared for TTS consult 

## 2015-02-05 DIAGNOSIS — F1910 Other psychoactive substance abuse, uncomplicated: Secondary | ICD-10-CM | POA: Insufficient documentation

## 2015-02-05 MED ORDER — OXYCODONE-ACETAMINOPHEN 5-325 MG PO TABS
1.0000 | ORAL_TABLET | Freq: Three times a day (TID) | ORAL | Status: DC
  Administered 2015-02-05: 1 via ORAL
  Filled 2015-02-05: qty 1

## 2015-02-05 MED ORDER — OXYCODONE-ACETAMINOPHEN 5-325 MG PO TABS
1.0000 | ORAL_TABLET | Freq: Once | ORAL | Status: AC
  Administered 2015-02-05: 1 via ORAL
  Filled 2015-02-05: qty 1

## 2015-02-05 NOTE — Consult Note (Signed)
[facility] Consult   Reason for Consult:  Alcohol intoxication Referring Physician:  Lenise Arena, M.D Patient Identification: Melvin Brown MRN:  [id number] Principal Diagnosis: Alcohol intoxication                                     Alcohol use disorder severe                                     Opioid use disorder Diagnosis:   Patient Active Problem List   Diagnosis Date Noted  . Hypothyroidism [E03.9] 10/13/2014  . Elevated liver enzymes [R74.8] 10/13/2014  . Shoulder pain, acute [M25.519] 10/12/2014  . Peripheral edema [R60.9] 10/12/2014  . Chronic pain associated with significant psychosocial dysfunction [G89.4] 10/12/2014  . Disorder of rotator cuff [M67.919] 10/12/2014  . Chronic pain syndrome [G89.4] 09/08/2013  . Environmental allergies [Z91.048] 09/08/2013  . GERD (gastroesophageal reflux disease) [K21.9] 09/08/2013  . Abnormal toxicological findings [R89.2] 08/07/2013  . Overweight [E66.3] 05/11/2013  . Asthma, mild intermittent [J45.20] 02/11/2013  . Hamstring muscle strain [S76.319A] 02/10/2013  . Allergic rhinitis, seasonal [J30.2] 08/12/2012  . Gastro-esophageal reflux disease without esophagitis [K21.9] 04/16/2011    Total Time spent with patient: 1 hour  Subjective:   Melvin Brown is a 39 y.o. male patient admitted with alcohol intoxication.  HPI:    Patient is a 39 year old male who presented to the ER on involuntary commitment as he reported that he was drinking heavily at his girlfriend's house and fell over there. Ambulance was called by the girlfriend as she became concerned. He has been consuming alcohol and was also using his opioid pain medications which are prescribed to him. Patient reported that he is currently on a prescription of oxycodone and was minimizing the use of Suboxone. He reported that he ran out of Suboxone 2 days ago although he was not supposed to get his Suboxone for the next couple of days. Patient reported  that he is supposed to take 2 strips on a daily basis. I obtained his [place] controlled. Registry and patient is not supposed to get his next prescription until Wednesday. Patient stated that he is currently experiencing withdrawal symptoms from the opioids. He currently denied having any suicidal homicidal ideations or plans. He reported that he lives with his girlfriend and is supported by his mother.  Past Psychiatric History:  History of opioid use in the past. He reported that he has chronic pain syndrome. He follows with his Suboxone clinic in the room as he reported that it is the closest. He denied any history of suicide attempts in the past. Patient reported that he is currently on probation due to driving without a license.  Risk to Self: Suicidal Ideation: No Suicidal Intent: No Is patient at risk for suicide?: No Suicidal Plan?: No Access to Means: No What has been your use of drugs/alcohol within the last 12 months?: Alcohol Consumption "Occasionally" How many times?: 0 Other Self Harm Risks: None Reported Intentional Self Injurious Behavior: None Risk to Others: Homicidal Ideation: No Thoughts of Harm to Others: No Current Homicidal Intent: No Current Homicidal Plan: No Access to Homicidal Means: No Identified Victim: NA History of harm to others?: No Assessment of Violence: None Noted Violent Behavior Description: NA Does patient have access to weapons?: No Criminal Charges Pending?: No  Does patient have a court date: Yes Court Date:  (Pt is unsure but believes 03/2015) Prior Inpatient Therapy: Prior Inpatient Therapy: No Prior Therapy Dates: NA Prior Therapy Facilty/Provider(s): NA Reason for Treatment: NA Prior Outpatient Therapy: Prior Outpatient Therapy: Yes Prior Therapy Dates: 2012 Prior Therapy Facilty/Provider(s): Unable to Recall  Reason for Treatment: Daughter Passed Away Does patient have an ACCT team?: No Does patient have Intensive In-House  Services?  : No Does patient have [facility] services? : No Does patient have P4CC services?: No  Past Medical History:  Past Medical History  Diagnosis Date  . GERD (gastroesophageal reflux disease)   . Asthma   . Arthritis   . Frequent headaches   . Torn rotator cuff 2002    right    Past Surgical History  Procedure Laterality Date  . Arm amputation Left   . Pelvic fracture surgery    . Arm reconstruction Right     X 5   Family History:  Family History  Problem Relation Age of Onset  . Arthritis Maternal Grandfather   . Cancer Maternal Grandfather     Colon  . Diabetes Neg Hx   . Hearing loss Neg Hx   . Stroke Neg Hx   . Arthritis Paternal Grandmother   . Heart attack Paternal Grandfather   . SIDS Daughter    Family Psychiatric  History:   History of drug use in the family Social History:  History  Alcohol Use No     History  Drug Use No    Social History   Social History  . Marital Status: Single    Spouse Name: N/A  . Number of Children: N/A  . Years of Education: N/A   Social History Main Topics  . Smoking status: Never Smoker   . Smokeless tobacco: Never Used  . Alcohol Use: No  . Drug Use: No  . Sexual Activity: Yes   Other Topics Concern  . None   Social History Narrative   Additional Social History:    Pain Medications: Percocet Over the Counter: Denies History of alcohol / drug use?: Yes Name of Substance 1: Alcohol 1 - Age of First Use: Not Reported 1 - Amount (size/oz): "2-6 12 oz" 1 - Frequency: "Occasionally", 1x/wk 1 - Last Use / Amount: 02/04/15/ "I'm not really sure I think"                   Allergies:  No Known Allergies  Labs:  Results for orders placed or performed during the hospital encounter of 02/04/15 (from the past 48 hour(s))  CBC with Differential     Status: None   Collection Time: 02/04/15  8:40 PM  Result Value Ref Range   WBC 7.4 3.8 - 10.6 K/uL   RBC 4.63 4.40 - 5.90 MIL/uL   Hemoglobin 15.0  13.0 - 18.0 g/dL   HCT 43.7 40.0 - 52.0 %   MCV 94.4 80.0 - 100.0 fL   MCH 32.5 26.0 - 34.0 pg   MCHC 34.4 32.0 - 36.0 g/dL   RDW 14.4 11.5 - 14.5 %   Platelets 176 150 - 440 K/uL   Neutrophils Relative % 47 %   Neutro Abs 3.5 1.4 - 6.5 K/uL   Lymphocytes Relative 39 %   Lymphs Abs 2.9 1.0 - 3.6 K/uL   Monocytes Relative 10 %   Monocytes Absolute 0.8 0.2 - 1.0 K/uL   Eosinophils Relative 3 %   Eosinophils Absolute 0.2 0 - 0.7  K/uL   Basophils Relative 1 %   Basophils Absolute 0.1 0 - 0.1 K/uL  Comprehensive metabolic panel     Status: Abnormal   Collection Time: 02/04/15  8:40 PM  Result Value Ref Range   Sodium 142 135 - 145 mmol/L   Potassium 3.4 (L) 3.5 - 5.1 mmol/L   Chloride 106 101 - 111 mmol/L   CO2 25 22 - 32 mmol/L   Glucose, Bld 102 (H) 65 - 99 mg/dL   BUN 8 6 - 20 mg/dL   Creatinine, Ser 0.87 0.61 - 1.24 mg/dL   Calcium 8.5 (L) 8.9 - 10.3 mg/dL   Total Protein 7.8 6.5 - 8.1 g/dL   Albumin 3.9 3.5 - 5.0 g/dL   AST 119 (H) 15 - 41 U/L   ALT 87 (H) 17 - 63 U/L   Alkaline Phosphatase 79 38 - 126 U/L   Total Bilirubin 0.9 0.3 - 1.2 mg/dL   GFR calc non Af Amer >60 >60 mL/min   GFR calc Af Amer >60 >60 mL/min    Comment: (NOTE) The eGFR has been calculated using the CKD EPI equation. This calculation has not been validated in all clinical situations. eGFR's persistently <60 mL/min signify possible Chronic Kidney Disease.    Anion gap 11 5 - 15  Troponin I     Status: None   Collection Time: 02/04/15  8:40 PM  Result Value Ref Range   Troponin I <0.03 <0.031 ng/mL    Comment:        NO INDICATION OF MYOCARDIAL INJURY.   Urinalysis complete, with microscopic     Status: Abnormal   Collection Time: 02/04/15  8:40 PM  Result Value Ref Range   Color, Urine YELLOW (A) YELLOW   APPearance CLEAR (A) CLEAR   Glucose, UA NEGATIVE NEGATIVE mg/dL   Bilirubin Urine NEGATIVE NEGATIVE   Ketones, ur NEGATIVE NEGATIVE mg/dL   Specific Gravity, Urine 1.008 1.005 - 1.030    Hgb urine dipstick NEGATIVE NEGATIVE   pH 6.0 5.0 - 8.0   Protein, ur NEGATIVE NEGATIVE mg/dL   Nitrite NEGATIVE NEGATIVE   Leukocytes, UA NEGATIVE NEGATIVE   RBC / HPF NONE SEEN 0 - 5 RBC/hpf   WBC, UA 0-5 0 - 5 WBC/hpf   Bacteria, UA NONE SEEN NONE SEEN   Squamous Epithelial / LPF 0-5 (A) NONE SEEN   Mucous PRESENT   Urine Drug Screen, Qualitative ([facility] only)     Status: Abnormal   Collection Time: 02/04/15  8:40 PM  Result Value Ref Range   Tricyclic, Ur Screen NONE DETECTED NONE DETECTED   Amphetamines, Ur Screen NONE DETECTED NONE DETECTED   MDMA (Ecstasy)Ur Screen NONE DETECTED NONE DETECTED   Cocaine Metabolite,Ur [place] NONE DETECTED NONE DETECTED   Opiate, Ur Screen POSITIVE (A) NONE DETECTED   Phencyclidine (PCP) Ur S NONE DETECTED NONE DETECTED   Cannabinoid 50 Ng, Ur [place] NONE DETECTED NONE DETECTED   Barbiturates, Ur Screen NONE DETECTED NONE DETECTED   Benzodiazepine, Ur Scrn NONE DETECTED NONE DETECTED   Methadone Scn, Ur NONE DETECTED NONE DETECTED    Comment: (NOTE) 292  Tricyclics, urine               Cutoff 1000 ng/mL 200  Amphetamines, urine             Cutoff 1000 ng/mL 300  MDMA (Ecstasy), urine           Cutoff 500 ng/mL 400  Cocaine Metabolite, urine  Cutoff 300 ng/mL 500  Opiate, urine                   Cutoff 300 ng/mL 600  Phencyclidine (PCP), urine      Cutoff 25 ng/mL 700  Cannabinoid, urine              Cutoff 50 ng/mL 800  Barbiturates, urine             Cutoff 200 ng/mL 900  Benzodiazepine, urine           Cutoff 200 ng/mL 1000 Methadone, urine                Cutoff 300 ng/mL 1100 1200 The urine drug screen provides only a preliminary, unconfirmed 1300 analytical test result and should not be used for non-medical 1400 purposes. Clinical consideration and professional judgment should 1500 be applied to any positive drug screen result due to possible 1600 interfering substances. A more specific alternate chemical method 1700 must be used in  order to obtain a confirmed analytical result.  1800 Gas chromato graphy / mass spectrometry (GC/MS) is the preferred 1900 confirmatory method.   Ethanol     Status: Abnormal   Collection Time: 02/04/15  8:40 PM  Result Value Ref Range   Alcohol, Ethyl (B) 341 (HH) <5 mg/dL    Comment: CRITICAL RESULT CALLED TO, READ BACK BY AND VERIFIED WITH AMANDA LOVETT @ 2232 ON 02/04/2015 BY [facility]        LOWEST DETECTABLE LIMIT FOR SERUM ALCOHOL IS 5 mg/dL FOR MEDICAL PURPOSES ONLY     Current Facility-Administered Medications  Medication Dose Route Frequency Provider Last Rate Last Dose  . oxyCODONE-acetaminophen (PERCOCET/ROXICET) 5-325 MG per tablet 1 tablet  1 tablet Oral Q8H Ahmed Prima, MD   1 tablet at 02/05/15 3614   Current Outpatient Prescriptions  Medication Sig Dispense Refill  . diclofenac (CATAFLAM) 50 MG tablet Take 1 tablet by mouth 3 (three) times daily.    Marland Kitchen gabapentin (NEURONTIN) 600 MG tablet Take 1 tablet by mouth 3 (three) times daily.    Marland Kitchen HYDROcodone-acetaminophen (NORCO/VICODIN) 5-325 MG tablet Take 1 tablet by mouth 3 (three) times daily.    . naproxen (NAPROSYN) 500 MG tablet Take 500 mg by mouth 2 (two) times daily.    . OXYCODONE ER PO Take 4 tablets by mouth.    . SUBOXONE 8-2 MG FILM Take 2.5 strips by mouth daily.  0  . SYMBICORT 160-4.5 MCG/ACT inhaler Inhale 2 puffs into the lungs daily.      Musculoskeletal: Strength & Muscle Tone: decreased Gait & Station: Laying in the bed Patient leans: N/A  Psychiatric Specialty Exam: Review of Systems  Constitutional: Positive for malaise/fatigue.  Musculoskeletal: Positive for myalgias and neck pain.  Psychiatric/Behavioral: Positive for depression and substance abuse. The patient is nervous/anxious and has insomnia.     Blood pressure 154/137, pulse 85, resp. rate 21, SpO2 91 %.There is no weight on file to calculate BMI.  General Appearance: Disheveled  Eye Sport and exercise psychologist::  Fair  Speech:  Slow  Volume:   Decreased  Mood:  Anxious and Depressed  Affect:  Appropriate and Constricted  Thought Process:  Disorganized  Orientation:  Full (Time, Place, and Person)  Thought Content:  WDL  Suicidal Thoughts:  No  Homicidal Thoughts:  No  Memory:  Immediate;   Fair  Judgement:  Impaired  Insight:  Lacking  Psychomotor Activity:  Decreased  Concentration:  Poor  Recall:  Poor  Fund of Knowledge:Fair  Language: Fair  Akathisia:  No  Handed:  Right  AIMS (if indicated):     Assets:  Communication Skills Desire for Improvement Social Support  ADL's:  Intact  Cognition: WNL  Sleep:      Treatment Plan Summary: Medication management  Disposition: No evidence of imminent risk to self or others at present.   Patient does not meet criteria for psychiatric inpatient admission.   Will refer patient to the substance abuse rehabilitation program. Discussed with the behavioral health counselor Elmyra Ricks and she agreed with the plan.    Rainey Pines 02/05/2015 10:51 AM

## 2015-02-05 NOTE — Discharge Instructions (Signed)
Who had a high alcohol level and you're significantly impaired with other substances as well. We are concerned for her safety. Please seek further treatment, care, for this problem. Return to the emergency department if you have any further urgent concerns.   Polysubstance Abuse When people abuse more than one drug or type of drug it is called polysubstance or polydrug abuse. For example, many smokers also drink alcohol. This is one form of polydrug abuse. Polydrug abuse also refers to the use of a drug to counteract an unpleasant effect produced by another drug. It may also be used to help with withdrawal from another drug. People who take stimulants may become agitated. Sometimes this agitation is countered with a tranquilizer. This helps protect against the unpleasant side effects. Polydrug abuse also refers to the use of different drugs at the same time.  Anytime drug use is interfering with normal living activities, it has become abuse. This includes problems with family and friends. Psychological dependence has developed when your mind tells you that the drug is needed. This is usually followed by physical dependence which has developed when continuing increases of drug are required to get the same feeling or "high". This is known as addiction or chemical dependency. A person's risk is much higher if there is a history of chemical dependency in the family. SIGNS OF CHEMICAL DEPENDENCY  You have been told by friends or family that drugs have become a problem.  You fight when using drugs.  You are having blackouts (not remembering what you do while using).  You feel sick from using drugs but continue using.  You lie about use or amounts of drugs (chemicals) used.  You need chemicals to get you going.  You are suffering in work performance or in school because of drug use.  You get sick from use of drugs but continue to use anyway.  You need drugs to relate to people or feel comfortable in  social situations.  You use drugs to forget problems. "Yes" answered to any of the above signs of chemical dependency indicates there are problems. The longer the use of drugs continues, the greater the problems will become. If there is a family history of drug or alcohol use, it is best not to experiment with these drugs. Continual use leads to tolerance. After tolerance develops more of the drug is needed to get the same feeling. This is followed by addiction. With addiction, drugs become the most important part of life. It becomes more important to take drugs than participate in the other usual activities of life. This includes relating to friends and family. Addiction is followed by dependency. Dependency is a condition where drugs are now needed not just to get high, but to feel normal. Addiction cannot be cured but it can be stopped. This often requires outside help and the care of professionals. Treatment centers are listed in the yellow pages under: Cocaine, Narcotics, and Alcoholics Anonymous. Most hospitals and clinics can refer you to a specialized care center. Talk to your caregiver if you need help.   This information is not intended to replace advice given to you by your health care provider. Make sure you discuss any questions you have with your health care provider.   Document Released: 11/13/2004 Document Revised: 06/16/2011 Document Reviewed: 03/29/2014 Elsevier Interactive Patient Education [employer]2016 [employer]

## 2015-02-05 NOTE — [facility] Counselor (Signed)
Pt states he is in pain (interfering with interview participation) and in need of medication. Pt does appear to be experiencing some type of discomfort. Writer informed Pt RN Jeannett Senior(Stephen) and EDP Dr.Sung. Writer informed Pt she would return at momentarily to complete assessment.

## 2015-02-05 NOTE — ED Notes (Signed)
Warm blanket given to pt per request. Pt calm and resting soundly.

## 2015-02-05 NOTE — Progress Notes (Signed)
Pt will be provided with referral information for outpatient substance abuse programing.   02/05/2015 Melvin FlashNicole Rosalena Mccorry, MS, NCC, LPCA Therapeutic Triage Specialist

## 2015-02-05 NOTE — ED Provider Notes (Addendum)
-----------------------------------------   7:04 AM on 02/05/2015 -----------------------------------------   Blood pressure 154/137, pulse 85, resp. rate 21, SpO2 91 %.  The patient had no acute events since last update.  Calm and cooperative at this time.  Nursing to recheck vital signs this morning.  Disposition is pending per Psychiatry/Behavioral Medicine team recommendations.     Irean HongJade J Saramarie Stinger, MD 02/05/15 [id number]  Irean HongJade J Lateesha Bezold, MD 02/05/15 [phone]
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
## 2015-02-05 NOTE — ED Notes (Signed)
Patient dressed out of personal clothes and into behavorial clothes by this RN and ED Tech. [facility] and into [facility] locker room. Other belongings sent home with fiance.

## 2015-02-05 NOTE — ED Provider Notes (Addendum)
-----------------------------------------   1:42 PM on 02/05/2015 -----------------------------------------  Mr. Melvin Brown was initially seen by Dr. Mayford KnifeWilliams yesterday and placed under involuntary commitment. Today he was seen by psychiatry, Dr. Garnetta BuddyFaheem. He has no thoughts of suicide or homicidal ideation. He does not meet ongoing criteria for commitment and she is releasing him from the commitment for outpatient substance abuse treatment.  The patient is alert and communicative. He agrees the plan. His girlfriend knows of the plan as well and wished for him to be discharged soon due to other obligations.  ----------------------------------------- 2:22 PM on 02/05/2015 -----------------------------------------  Dr. Garnetta BuddyFaheem have not written a resection of the IVC order. I spoke with her by phone and she let me know that Dr. Toni Amendlapacs is come to the emergency Department and couldn't do so. I have spoke with Dr. Toni Amendlapacs who will see the patient.  ----------------------------------------- 2:33 PM on 02/05/2015 -----------------------------------------  Patient has been seen by Dr. Toni Amendlapacs who rescinded the IVC. Patient will be discharged.   Final diagnosis:  Alcohol intoxication Polysubstance abuse   Melvin Ramusavid W Lakendria Nicastro, MD 02/05/15 [phone]

## 2015-02-06 ENCOUNTER — Ambulatory Visit: Payer: Medicare Other | Attending: Pain Medicine | Admitting: Pain Medicine

## 2015-02-06 ENCOUNTER — Encounter: Payer: Self-pay | Admitting: Pain Medicine

## 2015-02-06 ENCOUNTER — Other Ambulatory Visit: Payer: Self-pay | Admitting: Pain Medicine

## 2015-02-06 VITALS — BP 121/85 | HR 86 | Temp 97.7°F | Resp 20 | Ht 69.0 in | Wt 212.0 lb

## 2015-02-06 DIAGNOSIS — I10 Essential (primary) hypertension: Secondary | ICD-10-CM | POA: Diagnosis not present

## 2015-02-06 DIAGNOSIS — G8929 Other chronic pain: Secondary | ICD-10-CM | POA: Diagnosis not present

## 2015-02-06 DIAGNOSIS — G546 Phantom limb syndrome with pain: Secondary | ICD-10-CM

## 2015-02-06 DIAGNOSIS — Z9889 Other specified postprocedural states: Secondary | ICD-10-CM | POA: Diagnosis not present

## 2015-02-06 DIAGNOSIS — M79601 Pain in right arm: Secondary | ICD-10-CM | POA: Diagnosis not present

## 2015-02-06 DIAGNOSIS — K1120 Sialoadenitis, unspecified: Secondary | ICD-10-CM | POA: Diagnosis not present

## 2015-02-06 DIAGNOSIS — G894 Chronic pain syndrome: Secondary | ICD-10-CM | POA: Diagnosis not present

## 2015-02-06 DIAGNOSIS — J4520 Mild intermittent asthma, uncomplicated: Secondary | ICD-10-CM | POA: Diagnosis not present

## 2015-02-06 DIAGNOSIS — M25511 Pain in right shoulder: Secondary | ICD-10-CM | POA: Diagnosis not present

## 2015-02-06 DIAGNOSIS — G5601 Carpal tunnel syndrome, right upper limb: Secondary | ICD-10-CM

## 2015-02-06 DIAGNOSIS — M79631 Pain in right forearm: Secondary | ICD-10-CM | POA: Insufficient documentation

## 2015-02-06 DIAGNOSIS — J45909 Unspecified asthma, uncomplicated: Secondary | ICD-10-CM

## 2015-02-06 DIAGNOSIS — E039 Hypothyroidism, unspecified: Secondary | ICD-10-CM | POA: Diagnosis not present

## 2015-02-06 DIAGNOSIS — M47816 Spondylosis without myelopathy or radiculopathy, lumbar region: Secondary | ICD-10-CM | POA: Diagnosis not present

## 2015-02-06 DIAGNOSIS — M6249 Contracture of muscle, multiple sites: Secondary | ICD-10-CM

## 2015-02-06 DIAGNOSIS — M79603 Pain in arm, unspecified: Secondary | ICD-10-CM | POA: Diagnosis present

## 2015-02-06 DIAGNOSIS — Z79891 Long term (current) use of opiate analgesic: Secondary | ICD-10-CM | POA: Diagnosis not present

## 2015-02-06 DIAGNOSIS — M545 Low back pain: Secondary | ICD-10-CM

## 2015-02-06 DIAGNOSIS — F1190 Opioid use, unspecified, uncomplicated: Secondary | ICD-10-CM | POA: Diagnosis not present

## 2015-02-06 DIAGNOSIS — G5600 Carpal tunnel syndrome, unspecified upper limb: Secondary | ICD-10-CM | POA: Insufficient documentation

## 2015-02-06 DIAGNOSIS — R748 Abnormal levels of other serum enzymes: Secondary | ICD-10-CM | POA: Diagnosis not present

## 2015-02-06 DIAGNOSIS — F1990 Other psychoactive substance use, unspecified, uncomplicated: Secondary | ICD-10-CM

## 2015-02-06 DIAGNOSIS — S48912S Complete traumatic amputation of left shoulder and upper arm, level unspecified, sequela: Secondary | ICD-10-CM

## 2015-02-06 DIAGNOSIS — Z5181 Encounter for therapeutic drug level monitoring: Secondary | ICD-10-CM | POA: Insufficient documentation

## 2015-02-06 DIAGNOSIS — K219 Gastro-esophageal reflux disease without esophagitis: Secondary | ICD-10-CM | POA: Insufficient documentation

## 2015-02-06 DIAGNOSIS — M47896 Other spondylosis, lumbar region: Secondary | ICD-10-CM

## 2015-02-06 DIAGNOSIS — Z79899 Other long term (current) drug therapy: Secondary | ICD-10-CM

## 2015-02-06 DIAGNOSIS — M62838 Other muscle spasm: Secondary | ICD-10-CM

## 2015-02-06 DIAGNOSIS — S48912A Complete traumatic amputation of left shoulder and upper arm, level unspecified, initial encounter: Secondary | ICD-10-CM

## 2015-02-06 HISTORY — DX: Complete traumatic amputation of left shoulder and upper arm, level unspecified, initial encounter: S48.912A

## 2015-02-06 MED ORDER — GABAPENTIN 600 MG PO TABS: 600.0000 mg | ORAL_TABLET | Freq: Three times a day (TID) | ORAL | Status: DC

## 2015-02-06 MED ORDER — BACLOFEN 10 MG PO TABS: 10.0000 mg | ORAL_TABLET | Freq: Three times a day (TID) | ORAL | Status: DC

## 2015-02-06 MED ORDER — OXYCODONE HCL 10 MG PO TABS: 10.0000 mg | ORAL_TABLET | Freq: Four times a day (QID) | ORAL | Status: DC | PRN

## 2015-02-06 MED ORDER — MELOXICAM 15 MG PO TABS: 15.0000 mg | ORAL_TABLET | Freq: Every day | ORAL | Status: DC

## 2015-02-06 NOTE — Progress Notes (Signed)
Safety precautions to be maintained throughout the outpatient stay will include: orient to surroundings, keep bed in low position, maintain call bell within reach at all times, provide assistance with transfer out of bed and ambulation.  Follow up- post visit with Dr Romilda JoyLauder

## 2015-02-07 ENCOUNTER — Other Ambulatory Visit: Payer: Self-pay | Admitting: Family Medicine

## 2015-02-07 DIAGNOSIS — M47816 Spondylosis without myelopathy or radiculopathy, lumbar region: Secondary | ICD-10-CM | POA: Insufficient documentation

## 2015-02-07 DIAGNOSIS — F1990 Other psychoactive substance use, unspecified, uncomplicated: Secondary | ICD-10-CM | POA: Insufficient documentation

## 2015-02-07 DIAGNOSIS — G8929 Other chronic pain: Secondary | ICD-10-CM | POA: Insufficient documentation

## 2015-02-07 DIAGNOSIS — E669 Obesity, unspecified: Secondary | ICD-10-CM | POA: Insufficient documentation

## 2015-02-07 DIAGNOSIS — I10 Essential (primary) hypertension: Secondary | ICD-10-CM | POA: Insufficient documentation

## 2015-02-07 DIAGNOSIS — M545 Low back pain: Secondary | ICD-10-CM

## 2015-02-07 NOTE — Progress Notes (Signed)
Patient's Name: Melvin HolmesRobert A Wessman MRN: [id number] DOB: [birth year] DOS: 02/06/2015  Primary Reason(s) for Visit: Encounter for evaluation before starting a Medication. CC: Arm Pain   HPI:   Mr. Melvin LucksStout is a 39 y.o. year old, male patient, who returns today as an established patient. 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 His primarily concern today is the Arm Pain    The patient comes in today with significant distress and clear evidence of being depressed. He has a polysubstance abuse history which was addressed today. We have come to an agreement with her as to the use of the medications that we will be prescribing where he was informed that we will have a 0 tolerance to less than fully compliant behavior. He does understand that I will tolerate no illegal substances or the use of any medications for the pain, other than what we are prescribing. Both patient and his wife understood and accepted. Today we discontinued the diclofenac and then naproxen and we will  start him on Mobic 15 mg by mouth daily. He will also be started on baclofen 10 mg by mouth 3 times a day to 4 times a day to deal with the spasticity of his left upper extremity stump. In addition, we will be titrating and some gabapentin as tolerated. This will be used for the treatment of his phantom limb pain and neuropathic component of his pains. For somatic component of the pain we will be adding oxycodone 10 mg by mouth 4 times a day as this is a dose that he had taken before with relatively good success. He will be reevaluated in 1 month to determine if we need to adjust to therapy. Today's Pain Score: 10-Worst pain ever Pain Type: Chronic pain Pain Location: Arm Pain Orientation: Right Pain Descriptors / Indicators: Aching, Constant, Radiating, Sharp, Shooting Pain Frequency: Constant  Date of Last Visit: Date of Last Visit: 11/23/14 Service Provided on Last Visit: Service Provided on Last Visit: Evaluation  Pharmacotherapy Review:   Side-effects or Adverse reactions: Today we will be starting his medication regimen. Up until male he has not been taking anything for the pain in a regular basis. [place] PMP: Compliant with practice rules and regulations. Last UDS available in the system:     Component Value Date/Time   LABOPIA POSITIVE* 02/04/2015 2040   LABBENZ NONE DETECTED 02/04/2015 2040   AMPHETMU NONE DETECTED 02/04/2015 2040   THCU NONE DETECTED 02/04/2015 2040   LABBARB NONE DETECTED 02/04/2015 2040    DST: Compliant with practice rules and regulations. Lab work: No new labs ordered by our practice. Treatment compliance: Compliant. Substance Use Disorder (SUD) Risk Level: Low Planned course of action: Today we will be taking over his medication regimen and we will be starting him on  gabapentin, meloxicam, baclofen, and oxycodone. He will be given enough medication to last one month at which time we'll review his regimen.  Allergies: Mr. Cape has No Known  Allergies.  Meds: The patient has a current medication list which includes the following prescription(s): albuterol, gabapentin, lansoprazole, levothyroxine, pantoprazole, symbicort, baclofen, meloxicam, and oxycodone hcl. Requested Prescriptions   Signed Prescriptions Disp Refills  . gabapentin (NEURONTIN) 600 MG tablet 90 tablet 0    Sig: Take 1 tablet (600 mg total) by mouth 3 (three) times daily.  . meloxicam (MOBIC) 15 MG tablet 30 tablet 0    Sig: Take 1 tablet (15 mg total) by mouth daily.  . baclofen (LIORESAL) 10 MG tablet 90 each 0    Sig: Take 1 tablet (10 mg total) by mouth 3 (three) times daily.  . Oxycodone HCl 10 MG TABS 120 tablet 0    Sig: Take 1 tablet (10 mg total) by mouth every 6 (six) hours as needed.    ROS: Constitutional: Afebrile, no chills, well hydrated and well nourished Gastrointestinal: negative Musculoskeletal:negative Neurological: negative Behavioral/Psych: negative  PFSH: Medical:  Melvin Brown  has a past medical history of GERD (gastroesophageal reflux disease); Asthma; Arthritis; Frequent headaches; Torn rotator cuff (2002); Diabetes mellitus without complication (HCC); Chronic pain; Head injury; and Traumatic amputation of left arm (HCC) (10/26/1998) (02/06/2015). Family: family history includes Arthritis in his maternal grandfather and paternal grandmother; Cancer in his maternal grandfather; Heart attack in his paternal grandfather; SIDS in his daughter. There is no history of Diabetes, Hearing loss, or Stroke. Surgical:  has past surgical history that includes Arm amputation (Left); Pelvic fracture surgery; and ARM RECONSTRUCTION (Right). Tobacco:  reports that he has never smoked. He has never used smokeless tobacco. Alcohol:  reports that he does not drink alcohol. Drug:  reports that he does not use illicit drugs.  Physical Exam: Vitals:  Today's Vitals   02/06/15 1351 02/06/15 1352  BP: 121/85   Pulse: 86   Temp: 97.7 F (36.5 C)    TempSrc: Oral   Resp: 20   Height:  (1.753 m)   Weight: 212 lb (96.163 kg)   SpO2: 96%   PainSc:  10-Worst pain ever  Calculated BMI: Body mass index is 31.29 kg/(m^2). General appearance: alert, cooperative, appears stated age, moderate distress, moderately obese and He comes accompanied by his wife who first had to stop working because he cannot take care of himself. Eyes: conjunctivae/corneas clear. PERRL, EOM's intact. Fundi benign. Lungs: No evidence respiratory distress, no audible rales or ronchi and no use of accessory muscles of respiration Neck: no adenopathy, no carotid bruit, no JVD, supple, symmetrical, trachea midline and thyroid not enlarged, symmetric, no tenderness/mass/nodules Back: symmetric, no curvature. ROM normal. No CVA tenderness. Extremities: He is missing his left upper extremity which was traumatically amputated at the time of the accident. In the case of the right upper extremity he has undergone multiple surgeries and he has clear evidence of significant scar tissue and the forearm. This restricts his range of motion of the hand. He also has clear evidence of a positive Tinel sign over his median nerve suggesting a carpal tunnel syndrome. He has undergone surgery through that area and the back of the hand as well. Pulses: 2+ and symmetric Skin: Skin color, texture, turgor normal. No rashes or lesions Neurologic: Alert and oriented X 3, normal strength and tone. Normal symmetric reflexes. Normal coordination and gait. Except for the upper extremities which are described above.  Assessment: Encounter Diagnosis:  Primary Diagnosis: Chronic pain [G89.29]  Plan: Kishan was seen today for arm pain.  Diagnoses and all orders for this visit:  Chronic pain -     gabapentin (NEURONTIN) 600 MG tablet; Take 1 tablet (600 mg total) by mouth 3 (three) times daily. -     meloxicam (MOBIC) 15 MG tablet; Take 1 tablet (15 mg total) by mouth daily. -     Oxycodone  HCl 10 MG TABS; Take 1 tablet (10 mg total) by mouth every 6 (six) hours as needed.  Long term current use of opiate analgesic -     Drugs of abuse screen w/o alc, rtn urine-sln; Standing  Long term prescription opiate use  Opiate use  Right arm pain  Traumatic amputation of left arm, sequela (HCC)  Phantom limb syndrome with pain (HCC) (left upper extremity) -     gabapentin (NEURONTIN) 600 MG tablet; Take 1 tablet (600 mg total) by mouth 3 (three) times daily.  Muscle spasticity -     baclofen (LIORESAL) 10 MG tablet; Take 1 tablet (10 mg total) by mouth 3 (three) times daily.  Right forearm pain  Chronic right shoulder pain  Carpal tunnel syndrome of right wrist  Encounter for therapeutic drug level monitoring  Substance use disorder: LOW  Bronchial asthma, unspecified asthma severity, uncomplicated  Essential hypertension  Chronic low back pain  Other osteoarthritis of spine, lumbar region  Morbid obesity due to excess calories (HCC)     There are no Patient Instructions on file for this visit. Medications discontinued today:  Medications Discontinued During This Encounter  Medication Reason  . HYDROcodone-acetaminophen (NORCO/VICODIN) 5-325 MG tablet Error  . OXYCODONE ER PO Error  . SUBOXONE 8-2 MG FILM Error  . diclofenac (CATAFLAM) 50 MG tablet Change in therapy  . naproxen (NAPROSYN) 500 MG tablet Change in therapy  . gabapentin (NEURONTIN) 600 MG tablet Reorder   Medications administered today:  Mr. Dietrick had no medications administered during this visit.  Primary Care Physician: Olevia Perches, DO Location: [facility] Note by: Sydnee Levans. Laban Emperor, M.D, DABA, DABAPM, DABPM, DABIPP, FIPP

## 2015-02-16 LAB — TOXASSURE SELECT 13 (MW), URINE: PDF: 0

## 2015-02-19 ENCOUNTER — Ambulatory Visit: Payer: Medicare Other | Attending: Pain Medicine | Admitting: Pain Medicine

## 2015-02-19 ENCOUNTER — Encounter: Payer: Self-pay | Admitting: Pain Medicine

## 2015-02-19 ENCOUNTER — Other Ambulatory Visit: Payer: Self-pay | Admitting: Pain Medicine

## 2015-02-19 VITALS — BP 154/99 | HR 90 | Temp 97.9°F | Resp 18 | Ht 69.0 in | Wt 217.0 lb

## 2015-02-19 DIAGNOSIS — J45909 Unspecified asthma, uncomplicated: Secondary | ICD-10-CM | POA: Insufficient documentation

## 2015-02-19 DIAGNOSIS — E119 Type 2 diabetes mellitus without complications: Secondary | ICD-10-CM | POA: Insufficient documentation

## 2015-02-19 DIAGNOSIS — M542 Cervicalgia: Secondary | ICD-10-CM | POA: Insufficient documentation

## 2015-02-19 DIAGNOSIS — G894 Chronic pain syndrome: Secondary | ICD-10-CM

## 2015-02-19 DIAGNOSIS — Z79891 Long term (current) use of opiate analgesic: Secondary | ICD-10-CM

## 2015-02-19 DIAGNOSIS — I10 Essential (primary) hypertension: Secondary | ICD-10-CM | POA: Diagnosis not present

## 2015-02-19 DIAGNOSIS — F1120 Opioid dependence, uncomplicated: Secondary | ICD-10-CM | POA: Diagnosis not present

## 2015-02-19 DIAGNOSIS — M79631 Pain in right forearm: Secondary | ICD-10-CM | POA: Diagnosis not present

## 2015-02-19 DIAGNOSIS — K219 Gastro-esophageal reflux disease without esophagitis: Secondary | ICD-10-CM | POA: Diagnosis not present

## 2015-02-19 DIAGNOSIS — F1490 Cocaine use, unspecified, uncomplicated: Secondary | ICD-10-CM | POA: Diagnosis not present

## 2015-02-19 DIAGNOSIS — G546 Phantom limb syndrome with pain: Secondary | ICD-10-CM

## 2015-02-19 DIAGNOSIS — F1190 Opioid use, unspecified, uncomplicated: Secondary | ICD-10-CM

## 2015-02-19 DIAGNOSIS — R51 Headache: Secondary | ICD-10-CM | POA: Diagnosis not present

## 2015-02-19 DIAGNOSIS — G8929 Other chronic pain: Secondary | ICD-10-CM | POA: Diagnosis not present

## 2015-02-19 DIAGNOSIS — M25521 Pain in right elbow: Secondary | ICD-10-CM

## 2015-02-19 DIAGNOSIS — M47812 Spondylosis without myelopathy or radiculopathy, cervical region: Secondary | ICD-10-CM

## 2015-02-19 DIAGNOSIS — Z9889 Other specified postprocedural states: Secondary | ICD-10-CM | POA: Diagnosis not present

## 2015-02-19 DIAGNOSIS — M79641 Pain in right hand: Secondary | ICD-10-CM | POA: Insufficient documentation

## 2015-02-19 DIAGNOSIS — Z79899 Other long term (current) drug therapy: Secondary | ICD-10-CM | POA: Diagnosis not present

## 2015-02-19 DIAGNOSIS — J4520 Mild intermittent asthma, uncomplicated: Secondary | ICD-10-CM | POA: Diagnosis not present

## 2015-02-19 DIAGNOSIS — M47816 Spondylosis without myelopathy or radiculopathy, lumbar region: Secondary | ICD-10-CM | POA: Diagnosis not present

## 2015-02-19 DIAGNOSIS — M79601 Pain in right arm: Secondary | ICD-10-CM

## 2015-02-19 DIAGNOSIS — G5601 Carpal tunnel syndrome, right upper limb: Secondary | ICD-10-CM | POA: Insufficient documentation

## 2015-02-19 DIAGNOSIS — M6249 Contracture of muscle, multiple sites: Secondary | ICD-10-CM

## 2015-02-19 DIAGNOSIS — E039 Hypothyroidism, unspecified: Secondary | ICD-10-CM | POA: Diagnosis not present

## 2015-02-19 DIAGNOSIS — Z5181 Encounter for therapeutic drug level monitoring: Secondary | ICD-10-CM

## 2015-02-19 DIAGNOSIS — F1410 Cocaine abuse, uncomplicated: Secondary | ICD-10-CM

## 2015-02-19 DIAGNOSIS — M62838 Other muscle spasm: Secondary | ICD-10-CM

## 2015-02-19 DIAGNOSIS — M79603 Pain in arm, unspecified: Secondary | ICD-10-CM | POA: Diagnosis present

## 2015-02-19 MED ORDER — GABAPENTIN 600 MG PO TABS: 600.0000 mg | ORAL_TABLET | Freq: Three times a day (TID) | ORAL | Status: DC

## 2015-02-19 MED ORDER — BACLOFEN 10 MG PO TABS: 10.0000 mg | ORAL_TABLET | Freq: Four times a day (QID) | ORAL | Status: DC

## 2015-02-19 MED ORDER — MELOXICAM 15 MG PO TABS: 15.0000 mg | ORAL_TABLET | Freq: Every day | ORAL | Status: DC

## 2015-02-19 MED ORDER — OXYCODONE HCL 10 MG PO TABS: 10.0000 mg | ORAL_TABLET | Freq: Every day | ORAL | Status: DC | PRN

## 2015-02-19 NOTE — Progress Notes (Signed)
Safety precautions to be maintained throughout the outpatient stay will include: orient to surroundings, keep bed in low position, maintain call bell within reach at all times, provide assistance with transfer out of bed and ambulation. Did not bring meds for pill count.  Reminded to bring to all appointments

## 2015-02-19 NOTE — Progress Notes (Signed)
Patient states the pain meds help some, and it helps with the shaking, but it only lasts 3-4 hours.

## 2015-02-19 NOTE — Progress Notes (Signed)
Patient's Name: Melvin Brown MRN: [id number] DOB: 31-Jul-1975 DOS: 02/19/2015  Primary Reason(s) for Visit: Encounter for Medication Management. CC: Arm Pain   HPI:   Melvin Brown is a 39 y.o. year old, male patient, who returns today as an established patient. He has Chronic pain syndrome; Environmental allergies; GERD (gastroesophageal reflux disease); Peripheral edema; Chronic pain associated with significant psychosocial dysfunction; Asthma, mild intermittent; Overweight; Abnormal toxicological findings; Disorder of rotator cuff; Allergic rhinitis, seasonal; Hypothyroidism; Elevated liver enzymes; Polysubstance abuse; Chronic pain; Long term current use of opiate analgesic; Long term prescription opiate use; Opiate use; Right arm pain; Traumatic amputation of left arm (HCC) (DOI: 10/26/1998); Phantom limb syndrome with pain (HCC) (left upper extremity); Muscle spasticity; Right forearm pain (Plate and screw fixation of right radius and ulnar shaft fractures; 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); Other shoulder lesions, right shoulder; Chronic right shoulder pain; Carpal tunnel syndrome (right side); Encounter for therapeutic drug level monitoring; Substance use disorder: High; Bronchial asthma; Essential hypertension; Chronic low back pain; Lumbar spondylosis (Old distracted right L5 transverse process fracture.); Class I Morbid obesity (HCC) (68% higher risk of back problems); Cocaine use; Chronic pain of right upper extremity; Chronic neck pain; Chronic pain of right hand (Mild right thumb metacarpophalangeal joint osteoarthrosis.); Chronic pain of right elbow (Chronic fractures and fusion radial and ulnar shaft fractures; evidence of hardware loosening and fracture nonunion of the ulnar fracture.); and Cervical spondylosis (degenerative change involving the uncovertebral joint on the right at C5-6.) on his problem list.. His primarily concern today is the Arm Pain    The patient is doing much better,  but unfortunately he did not bring his medications to the clinic today. They have been given a final warning with her as to this. He indicates that the medication does not seem to last the full 6 hours. Today's Pain Score: 8  Reported level of pain is incompatible with clinical obrservations. This may be secondary to a possible lack of understanding on how the pain scale works. Pain Type: Chronic pain Pain Location: Arm Pain Orientation: Right (and left stump) Pain Descriptors / Indicators: Burning, Throbbing (shaking, weakness) Pain Frequency: Constant  Date of Last Visit: Date of Last Visit: 02/06/15 Service Provided on Last Visit: Service Provided on Last Visit: Med Refill  Pharmacotherapy Review:   Side-effects or Adverse reactions: None reported. Effectiveness: Described as relatively effective, allowing for increase in activities of daily living (ADL). Onset of action: Within expected pharmacological parameters. Duration of action: Within normal limits for medication. Peak effect: Timing and results are as within normal expected parameters. [place] PMP: Compliant with practice rules and regulations. Last UDS available in the system:     Component Value Date/Time   LABOPIA POSITIVE* 02/04/2015 2040   LABBENZ NONE DETECTED 02/04/2015 2040   AMPHETMU NONE DETECTED 02/04/2015 2040   THCU NONE DETECTED 02/04/2015 2040   LABBARB NONE DETECTED 02/04/2015 2040    UDS: Compliant with practice rules and regulations. Lab work: No new labs ordered by our practice. Treatment compliance: Compliant. Substance Use Disorder (SUD) Risk Level: Low Planned course of action: Adjust therapy.  Allergies: Melvin Brown has No Known Allergies.  Meds: The patient has a current medication list which includes the following prescription(s): albuterol, baclofen, gabapentin, lansoprazole, levothyroxine, meloxicam, oxycodone hcl, pantoprazole, and symbicort. Requested Prescriptions   Signed Prescriptions Disp  Refills  . meloxicam (MOBIC) 15 MG tablet 30 tablet 0    Sig: Take 1 tablet (15 mg total) by mouth daily.  Marland Kitchen  Oxycodone HCl 10 MG TABS 150 tablet 0    Sig: Take 1 tablet (10 mg total) by mouth 5 (five) times daily as needed.  . gabapentin (NEURONTIN) 600 MG tablet 120 tablet 0    Sig: Take 1 tablet (600 mg total) by mouth every 8 (eight) hours.  . baclofen (LIORESAL) 10 MG tablet 120 each 0    Sig: Take 1 tablet (10 mg total) by mouth 4 (four) times daily.    ROS: Constitutional: Afebrile, no chills, well hydrated and well nourished Gastrointestinal: negative Musculoskeletal:negative Neurological: negative Behavioral/Psych: negative  PFSH: Medical:  Melvin Brown  has a past medical history of GERD (gastroesophageal reflux disease); Asthma; Arthritis; Frequent headaches; Torn rotator cuff (2002); Diabetes mellitus without complication (HCC); Chronic pain; Head injury; and Traumatic amputation of left arm (HCC) (10/26/1998) (02/06/2015). Family: family history includes Arthritis in his maternal grandfather and paternal grandmother; Cancer in his maternal grandfather; Heart attack in his paternal grandfather; SIDS in his daughter. There is no history of Diabetes, Hearing loss, or Stroke. Surgical:  has past surgical history that includes Arm amputation (Left); Pelvic fracture surgery; and ARM RECONSTRUCTION (Right). Tobacco:  reports that he has never smoked. He has never used smokeless tobacco. Alcohol:  reports that he does not drink alcohol. Drug:  reports that he does not use illicit drugs.  Physical Exam: Vitals:  Today's Vitals   02/19/15 1312 02/19/15 1314  BP: 154/99   Pulse: 90   Temp: 97.9 F (36.6 C)   Resp: 18   Height: 5\' 9"  (1.753 m)   Weight: 217 lb (98.431 kg)   SpO2: 92%   PainSc: 8  8   PainLoc: Arm   Calculated BMI: Body mass index is 32.03 kg/(m^2). General appearance: alert, cooperative, appears stated age, mild distress and moderately obese Eyes:  conjunctivae/corneas clear. PERRL, EOM's intact. Fundi benign. Lungs: No evidence respiratory distress, no audible rales or ronchi and no use of accessory muscles of respiration Neck: no adenopathy, no carotid bruit, no JVD, supple, symmetrical, trachea midline and thyroid not enlarged, symmetric, no tenderness/mass/nodules Back: symmetric, no curvature. ROM normal. No CVA tenderness. Extremities: Patient with traumatic amputation of the left upper extremity and clear evidence of multiple surgeries in the right upper extremity. Pulses: 2+ and symmetric Skin: Skin color, texture, turgor normal. No rashes or lesions Neurologic: Gait: Antalgic    Assessment: Encounter Diagnosis:  Primary Diagnosis: Chronic pain [G89.29]  Plan: Melvin MaduroRobert was seen today for arm pain.  Diagnoses and all orders for this visit:  Chronic pain -     meloxicam (MOBIC) 15 MG tablet; Take 1 tablet (15 mg total) by mouth daily. -     Oxycodone HCl 10 MG TABS; Take 1 tablet (10 mg total) by mouth 5 (five) times daily as needed. -     gabapentin (NEURONTIN) 600 MG tablet; Take 1 tablet (600 mg total) by mouth every 8 (eight) hours.  Chronic pain syndrome  Right forearm pain  Encounter for therapeutic drug level monitoring  Long term current use of opiate analgesic  Opiate use  Phantom limb syndrome with pain (HCC) (left upper extremity) -     gabapentin (NEURONTIN) 600 MG tablet; Take 1 tablet (600 mg total) by mouth every 8 (eight) hours.  Muscle spasticity -     baclofen (LIORESAL) 10 MG tablet; Take 1 tablet (10 mg total) by mouth 4 (four) times daily.  Cocaine use  Chronic pain of right upper extremity  Chronic neck pain  Chronic pain  of right hand (Mild right thumb metacarpophalangeal joint osteoarthrosis.)  Chronic pain of right elbow (Chronic fractures and fusion radial and ulnar shaft fractures; evidence of hardware loosening and fracture nonunion of the ulnar fracture.)  Cervical spondylosis  (degenerative change involving the uncovertebral joint on the right at C5-6.)     There are no Patient Instructions on file for this visit. Medications discontinued today:  Medications Discontinued During This Encounter  Medication Reason  . meloxicam (MOBIC) 15 MG tablet Reorder  . Oxycodone HCl 10 MG TABS Reorder  . gabapentin (NEURONTIN) 600 MG tablet Reorder  . baclofen (LIORESAL) 10 MG tablet Reorder   Medications administered today:  Melvin Brown had no medications administered during this visit.  Primary Care Physician: Olevia Perches, DO Location: [facility] Note by: Sydnee Levans. Laban Emperor, M.D, DABA, DABAPM, DABPM, DABIPP, FIPP

## 2015-02-20 DIAGNOSIS — M79641 Pain in right hand: Secondary | ICD-10-CM

## 2015-02-20 DIAGNOSIS — M47812 Spondylosis without myelopathy or radiculopathy, cervical region: Secondary | ICD-10-CM | POA: Insufficient documentation

## 2015-02-20 DIAGNOSIS — M79601 Pain in right arm: Secondary | ICD-10-CM

## 2015-02-20 DIAGNOSIS — F1490 Cocaine use, unspecified, uncomplicated: Secondary | ICD-10-CM | POA: Insufficient documentation

## 2015-02-20 DIAGNOSIS — G8929 Other chronic pain: Secondary | ICD-10-CM | POA: Insufficient documentation

## 2015-02-20 DIAGNOSIS — M25521 Pain in right elbow: Secondary | ICD-10-CM

## 2015-02-20 DIAGNOSIS — M542 Cervicalgia: Secondary | ICD-10-CM

## 2015-02-26 ENCOUNTER — Other Ambulatory Visit: Payer: Self-pay | Admitting: Family Medicine

## 2015-02-26 ENCOUNTER — Encounter: Payer: Medicare Other | Admitting: Family Medicine

## 2015-02-26 DIAGNOSIS — E039 Hypothyroidism, unspecified: Secondary | ICD-10-CM

## 2015-02-26 NOTE — Progress Notes (Signed)
Erroneous encounter  Review of Systems   Physical Exam       This encounter was created in error - please disregard.

## 2015-02-27 LAB — TOXASSURE SELECT 13 (MW), URINE: PDF: 0

## 2015-03-13 NOTE — Progress Notes (Signed)
Quick Note:  The findings of this UDT were reported as abnormal due to inconsistencies with expected results. An unreported substance was identified in the sample. Expectations were based on the medication history provided by the patient at the time of sample collection. Upon return, these results will be shared with the client in an effort to clarify them before making any changes in the clinical management of the medications. ______ 

## 2015-03-15 ENCOUNTER — Other Ambulatory Visit: Payer: Self-pay | Admitting: Pain Medicine

## 2015-03-16 ENCOUNTER — Other Ambulatory Visit: Payer: Self-pay | Admitting: Pain Medicine

## 2015-03-19 ENCOUNTER — Encounter: Payer: Self-pay | Admitting: Pain Medicine

## 2015-03-19 ENCOUNTER — Ambulatory Visit: Payer: Medicare Other | Attending: Pain Medicine | Admitting: Pain Medicine

## 2015-03-19 VITALS — BP 136/94 | HR 80 | Temp 98.6°F | Resp 20 | Ht 69.0 in | Wt 189.0 lb

## 2015-03-19 DIAGNOSIS — Z79899 Other long term (current) drug therapy: Secondary | ICD-10-CM | POA: Diagnosis not present

## 2015-03-19 DIAGNOSIS — K219 Gastro-esophageal reflux disease without esophagitis: Secondary | ICD-10-CM | POA: Diagnosis not present

## 2015-03-19 DIAGNOSIS — G546 Phantom limb syndrome with pain: Secondary | ICD-10-CM | POA: Insufficient documentation

## 2015-03-19 DIAGNOSIS — I10 Essential (primary) hypertension: Secondary | ICD-10-CM | POA: Diagnosis not present

## 2015-03-19 DIAGNOSIS — M47816 Spondylosis without myelopathy or radiculopathy, lumbar region: Secondary | ICD-10-CM | POA: Insufficient documentation

## 2015-03-19 DIAGNOSIS — M50322 Other cervical disc degeneration at C5-C6 level: Secondary | ICD-10-CM | POA: Insufficient documentation

## 2015-03-19 DIAGNOSIS — G5601 Carpal tunnel syndrome, right upper limb: Secondary | ICD-10-CM | POA: Diagnosis not present

## 2015-03-19 DIAGNOSIS — M79631 Pain in right forearm: Secondary | ICD-10-CM

## 2015-03-19 DIAGNOSIS — Z79891 Long term (current) use of opiate analgesic: Secondary | ICD-10-CM

## 2015-03-19 DIAGNOSIS — E039 Hypothyroidism, unspecified: Secondary | ICD-10-CM | POA: Insufficient documentation

## 2015-03-19 DIAGNOSIS — J309 Allergic rhinitis, unspecified: Secondary | ICD-10-CM | POA: Insufficient documentation

## 2015-03-19 DIAGNOSIS — G8929 Other chronic pain: Secondary | ICD-10-CM | POA: Diagnosis not present

## 2015-03-19 DIAGNOSIS — J45909 Unspecified asthma, uncomplicated: Secondary | ICD-10-CM | POA: Insufficient documentation

## 2015-03-19 DIAGNOSIS — R748 Abnormal levels of other serum enzymes: Secondary | ICD-10-CM | POA: Diagnosis not present

## 2015-03-19 DIAGNOSIS — J4520 Mild intermittent asthma, uncomplicated: Secondary | ICD-10-CM | POA: Diagnosis not present

## 2015-03-19 DIAGNOSIS — F1490 Cocaine use, unspecified, uncomplicated: Secondary | ICD-10-CM

## 2015-03-19 DIAGNOSIS — M79601 Pain in right arm: Secondary | ICD-10-CM | POA: Diagnosis not present

## 2015-03-19 DIAGNOSIS — M79603 Pain in arm, unspecified: Secondary | ICD-10-CM | POA: Diagnosis present

## 2015-03-19 DIAGNOSIS — F1410 Cocaine abuse, uncomplicated: Secondary | ICD-10-CM

## 2015-03-19 DIAGNOSIS — F1190 Opioid use, unspecified, uncomplicated: Secondary | ICD-10-CM

## 2015-03-19 DIAGNOSIS — Z5181 Encounter for therapeutic drug level monitoring: Secondary | ICD-10-CM

## 2015-03-19 MED ORDER — OXYCODONE HCL 10 MG PO TABS: 10.0000 mg | ORAL_TABLET | Freq: Every day | ORAL | Status: DC | PRN

## 2015-03-19 NOTE — Progress Notes (Signed)
Patient's Name: Melvin Brown MRN: [id number] DOB: April 04, 1976 DOS: 03/19/2015  Primary Reason(s) for Visit: Encounter for Medication Management CC: Arm Pain   HPI:   Mr. Davisson is a 39 y.o. year old, male patient, who returns today as an established patient. He has Chronic pain syndrome; Environmental allergies; GERD (gastroesophageal reflux disease); Peripheral edema; Chronic pain associated with significant psychosocial dysfunction; Asthma, mild intermittent; Overweight; Abnormal toxicological findings; Disorder of rotator cuff; Allergic rhinitis, seasonal; Hypothyroidism; Elevated liver enzymes; Polysubstance abuse; Chronic pain; Long term current use of opiate analgesic; Long term prescription opiate use; Opiate use; Right arm pain; Traumatic amputation of left arm (HCC) (DOI: 10/26/1998); Phantom limb syndrome with pain (HCC) (left upper extremity); Muscle spasticity; Right forearm pain (Plate and screw fixation of right radius and ulnar shaft fractures; 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); Other shoulder lesions, right shoulder; Chronic right shoulder pain; Carpal tunnel syndrome (right side); Encounter for therapeutic drug level monitoring; Substance use disorder: High (abnormal 02/19/2015 UDS with unexpected fentanyl and buprenorphine metabolites found); Bronchial asthma; Essential hypertension; Chronic low back pain; Lumbar spondylosis (Old distracted right L5 transverse process fracture.); Class I Morbid obesity (HCC) (68% higher risk of back problems); Cocaine use; Chronic pain of right upper extremity; Chronic neck pain; Chronic pain of right hand (Mild right thumb metacarpophalangeal joint osteoarthrosis.); Chronic pain of right elbow (Chronic fractures and fusion radial and ulnar shaft fractures; evidence of hardware loosening and fracture nonunion of the ulnar fracture.); and Cervical spondylosis (degenerative change involving the uncovertebral joint on the right at C5-6.) on his problem  list.. His primarily concern today is the Arm Pain     The patient returns to the clinic today for pharmacological management of his chronic pain. Today I confronted him about the last 2 UDS results which both came back abnormal. Today the patient was informed that if today's test comes back positive for anything else other than his oxycodone, he will be discharged from our services and he will receive no further refills.  Today's Pain Score: 7 , clinically he looks more like a 3-4/10. Reported level of pain is incompatible with clinical obrservations. This may be secondary to a possible lack of understanding on how the pain scale works. Pain Type: Chronic pain Pain Location: Arm Pain Descriptors / Indicators: Burning, Tingling, Constant Pain Frequency: Constant  Date of Last Visit: 02/19/15 Service Provided on Last Visit: Med Refill  Pharmacotherapy Review:   Side-effects or Adverse reactions: None reported Effectiveness: Described as relatively effective, allowing for increase in activities of daily living (ADL) Onset of action: Within expected pharmacological parameters Duration of action: Within normal limits for medication Peak effect: Timing and results are as within normal expected parameters [place] PMP: Compliant with practice rules and regulations Hospital associated UDS Results:  Lab Results  Component Value Date   THCU NONE DETECTED 02/04/2015   PCPSCRNUR NONE DETECTED 02/04/2015   MDMA NONE DETECTED 02/04/2015   AMPHETMU NONE DETECTED 02/04/2015   METHADONE NONE DETECTED 02/04/2015   UDS Results:   UDS Interpretation: Patient appears to be compliant with practice rules and regulations Medication Assessment Form: Reviewed. Patient indicates being compliant with therapy Treatment compliance: Compliant Substance Use Disorder (SUD) Risk Level: Moderate Pharmacologic Plan: Continue therapy as is  Last Available Lab Work: Orders Only on 02/19/2015  Component Date Value Ref  Range Status  . Report Summary 02/19/2015 FINAL   Final  . PDF 02/19/2015 .   Final  Orders Only on 02/06/2015  Component Date Value  Ref Range Status  . Report Summary 02/06/2015 FINAL   Final  . PDF 02/06/2015 .   Final  Admission on 02/04/2015, Discharged on 02/05/2015  Component Date Value Ref Range Status  . WBC 02/04/2015 7.4  3.8 - 10.6 K/uL Final  . RBC 02/04/2015 4.63  4.40 - 5.90 MIL/uL Final  . Hemoglobin 02/04/2015 15.0  13.0 - 18.0 g/dL Final  . HCT 32/44/0102 43.7  40.0 - 52.0 % Final  . MCV 02/04/2015 94.4  80.0 - 100.0 fL Final  . MCH 02/04/2015 32.5  26.0 - 34.0 pg Final  . MCHC 02/04/2015 34.4  32.0 - 36.0 g/dL Final  . RDW 72/53/6644 14.4  11.5 - 14.5 % Final  . Platelets 02/04/2015 176  150 - 440 K/uL Final  . Neutrophils Relative % 02/04/2015 47   Final  . Neutro Abs 02/04/2015 3.5  1.4 - 6.5 K/uL Final  . Lymphocytes Relative 02/04/2015 39   Final  . Lymphs Abs 02/04/2015 2.9  1.0 - 3.6 K/uL Final  . Monocytes Relative 02/04/2015 10   Final  . Monocytes Absolute 02/04/2015 0.8  0.2 - 1.0 K/uL Final  . Eosinophils Relative 02/04/2015 3   Final  . Eosinophils Absolute 02/04/2015 0.2  0 - 0.7 K/uL Final  . Basophils Relative 02/04/2015 1   Final  . Basophils Absolute 02/04/2015 0.1  0 - 0.1 K/uL Final  . Sodium 02/04/2015 142  135 - 145 mmol/L Final  . Potassium 02/04/2015 3.4* 3.5 - 5.1 mmol/L Final  . Chloride 02/04/2015 106  101 - 111 mmol/L Final  . CO2 02/04/2015 25  22 - 32 mmol/L Final  . Glucose, Bld 02/04/2015 102* 65 - 99 mg/dL Final  . BUN 03/47/4259 8  6 - 20 mg/dL Final  . Creatinine, Ser 02/04/2015 0.87  0.61 - 1.24 mg/dL Final  . Calcium 56/38/7564 8.5* 8.9 - 10.3 mg/dL Final  . Total Protein 02/04/2015 7.8  6.5 - 8.1 g/dL Final  . Albumin 33/29/5188 3.9  3.5 - 5.0 g/dL Final  . AST 41/66/0630 119* 15 - 41 U/L Final  . ALT 02/04/2015 87* 17 - 63 U/L Final  . Alkaline Phosphatase 02/04/2015 79  38 - 126 U/L Final  . Total Bilirubin 02/04/2015  0.9  0.3 - 1.2 mg/dL Final  . GFR calc non Af Amer 02/04/2015 >60  >60 mL/min Final  . GFR calc Af Amer 02/04/2015 >60  >60 mL/min Final  . Anion gap 02/04/2015 11  5 - 15 Final  . Troponin I 02/04/2015 <0.03  <0.031 ng/mL Final  . Color, Urine 02/04/2015 YELLOW* YELLOW Final  . APPearance 02/04/2015 CLEAR* CLEAR Final  . Glucose, UA 02/04/2015 NEGATIVE  NEGATIVE mg/dL Final  . Bilirubin Urine 02/04/2015 NEGATIVE  NEGATIVE Final  . Ketones, ur 02/04/2015 NEGATIVE  NEGATIVE mg/dL Final  . Specific Gravity, Urine 02/04/2015 1.008  1.005 - 1.030 Final  . Hgb urine dipstick 02/04/2015 NEGATIVE  NEGATIVE Final  . pH 02/04/2015 6.0  5.0 - 8.0 Final  . Protein, ur 02/04/2015 NEGATIVE  NEGATIVE mg/dL Final  . Nitrite 16/04/930 NEGATIVE  NEGATIVE Final  . Leukocytes, UA 02/04/2015 NEGATIVE  NEGATIVE Final  . RBC / HPF 02/04/2015 NONE SEEN  0 - 5 RBC/hpf Final  . WBC, UA 02/04/2015 0-5  0 - 5 WBC/hpf Final  . Bacteria, UA 02/04/2015 NONE SEEN  NONE SEEN Final  . Squamous Epithelial / LPF 02/04/2015 0-5* NONE SEEN Final  . Mucous 02/04/2015 PRESENT   Final  .  Tricyclic, Ur Screen 02/04/2015 NONE DETECTED  NONE DETECTED Final  . Amphetamines, Ur Screen 02/04/2015 NONE DETECTED  NONE DETECTED Final  . MDMA (Ecstasy)Ur Screen 02/04/2015 NONE DETECTED  NONE DETECTED Final  . Cocaine Metabolite,Ur [place] 02/04/2015 NONE DETECTED  NONE DETECTED Final  . Opiate, Ur Screen 02/04/2015 POSITIVE* NONE DETECTED Final  . Phencyclidine (PCP) Ur S 02/04/2015 NONE DETECTED  NONE DETECTED Final  . Cannabinoid 50 Ng, Ur [place] 02/04/2015 NONE DETECTED  NONE DETECTED Final  . Barbiturates, Ur Screen 02/04/2015 NONE DETECTED  NONE DETECTED Final  . Benzodiazepine, Ur Scrn 02/04/2015 NONE DETECTED  NONE DETECTED Final  . Methadone Scn, Ur 02/04/2015 NONE DETECTED  NONE DETECTED Final  . Alcohol, Ethyl (B) 02/04/2015 341* <5 mg/dL Final    Allergies: Mr. Tomas has No Known Allergies.  Meds: The patient has a current  medication list which includes the following prescription(s): albuterol, baclofen, gabapentin, lansoprazole, meloxicam, oxycodone hcl, pantoprazole, symbicort, and levothyroxine. Requested Prescriptions   Signed Prescriptions Disp Refills  . Oxycodone HCl 10 MG TABS 150 tablet 0    Sig: Take 1 tablet (10 mg total) by mouth 5 (five) times daily as needed.    ROS: Constitutional: Afebrile, no chills, well hydrated and well nourished Gastrointestinal: negative Musculoskeletal:negative Neurological: negative Behavioral/Psych: negative  PFSH: Medical:  Mr. Birky  has a past medical history of GERD (gastroesophageal reflux disease); Asthma; Arthritis; Frequent headaches; Torn rotator cuff (2002); Diabetes mellitus without complication (HCC); Chronic pain; Head injury; and Traumatic amputation of left arm (HCC) (10/26/1998) (02/06/2015). Family: family history includes Arthritis in his maternal grandfather and paternal grandmother; Cancer in his maternal grandfather; Heart attack in his paternal grandfather; SIDS in his daughter. There is no history of Diabetes, Hearing loss, or Stroke. Surgical:  has past surgical history that includes Arm amputation (Left); Pelvic fracture surgery; and ARM RECONSTRUCTION (Right). Tobacco:  reports that he has never smoked. He has never used smokeless tobacco. Alcohol:  reports that he does not drink alcohol. Drug:  reports that he does not use illicit drugs.  Physical Exam: Vitals:  Today's Vitals   03/19/15 1014 03/19/15 1016  BP: 136/94   Pulse: 80   Temp: 98.6 F (37 C)   TempSrc: Oral   Resp: 20   Height:  (1.753 m)   Weight: 189 lb (85.73 kg)   PainSc:  7   Calculated BMI: Body mass index is 27.9 kg/(m^2). General appearance: alert, cooperative, appears stated age, mild distress and moderately obese Eyes: PERLA Respiratory: No evidence respiratory distress, no audible rales or ronchi and no use of accessory muscles of respiration Neck: no  adenopathy, no carotid bruit, no JVD, supple, symmetrical, trachea midline and thyroid not enlarged, symmetric, no tenderness/mass/nodules  Cervical Spine ROM: Adequate for flexion, extension, rotation, and lateral bending Palpation: No palpable trigger points  Upper Extremities ROM: Left upper extremity amputation and decreased range of motion of the right upper extremity secondary to scar tissue from multiple surgeries. Strength: 5/5 for all flexors and extensors of the upper extremity, bilaterally Pulses: Palpable bilaterally Neurologic: No allodynia, No hyperesthesia, No hyperpathia and No sensory abnormalities  Lumbar Spine ROM: Adequate for flexion, extension, rotation, and lateral bending Palpation: No palpable trigger points Lumbar Hyperextension and rotation: Non-contributory Patrick's Maneuver: Non-contributory  Lower Extremities ROM: Adequate bilaterally Strength: 5/5 for all flexors and extensors of the lower extremity, bilaterally Pulses: Palpable bilaterally Neurologic: No allodynia, No hyperesthesia, No hyperpathia, No sensory abnormalities and No antalgic gait or posture  Assessment:  Encounter Diagnosis:  Primary Diagnosis: Chronic pain [G89.29]  Plan: Interventional Therapies: No interventional management at this point.  Molly MaduroRobert was seen today for arm pain.  Diagnoses and all orders for this visit:  Chronic pain -     Oxycodone HCl 10 MG TABS; Take 1 tablet (10 mg total) by mouth 5 (five) times daily as needed.  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 Medications discontinued today:   Medications Discontinued During This Encounter  Medication Reason  . Oxycodone HCl 10 MG TABS Reorder   Medications administered today:  [name] had no medications administered during this visit.  Primary Care Physician: Olevia PerchesMegan Johnson, DO [facility] Note by: Sydnee LevansFrancisco A. Laban EmperorNaveira, M.D, DABA, DABAPM, DABPM, DABIPP, FIPP

## 2015-03-19 NOTE — Progress Notes (Signed)
Safety precautions to be maintained throughout the outpatient stay will include: orient to surroundings, keep bed in low position, maintain call bell within reach at all times, provide assistance with transfer out of bed and ambulation.  Pill remaining 0/150 filled 03/08/15 states he spilled 70 pills in sink when he was trying to open bottle- Had to open with mouth cause noone was home and spilled them - states they went down drain- states he still has some Gabapentin

## 2015-04-30 ENCOUNTER — Encounter: Payer: Medicare Other | Admitting: Pain Medicine

## 2015-05-07 ENCOUNTER — Encounter: Payer: Self-pay | Admitting: Pain Medicine

## 2015-05-07 ENCOUNTER — Other Ambulatory Visit: Payer: Self-pay | Admitting: Pain Medicine

## 2015-05-07 ENCOUNTER — Ambulatory Visit: Payer: Medicare Other | Attending: Pain Medicine | Admitting: Pain Medicine

## 2015-05-07 VITALS — BP 158/108 | HR 92 | Temp 97.9°F | Resp 16 | Ht 69.0 in | Wt 215.0 lb

## 2015-05-07 DIAGNOSIS — E039 Hypothyroidism, unspecified: Secondary | ICD-10-CM | POA: Insufficient documentation

## 2015-05-07 DIAGNOSIS — G546 Phantom limb syndrome with pain: Secondary | ICD-10-CM

## 2015-05-07 DIAGNOSIS — Z79891 Long term (current) use of opiate analgesic: Secondary | ICD-10-CM

## 2015-05-07 DIAGNOSIS — M25521 Pain in right elbow: Secondary | ICD-10-CM | POA: Insufficient documentation

## 2015-05-07 DIAGNOSIS — M6249 Contracture of muscle, multiple sites: Secondary | ICD-10-CM

## 2015-05-07 DIAGNOSIS — J45998 Other asthma: Secondary | ICD-10-CM | POA: Diagnosis not present

## 2015-05-07 DIAGNOSIS — M545 Low back pain: Secondary | ICD-10-CM | POA: Insufficient documentation

## 2015-05-07 DIAGNOSIS — F1190 Opioid use, unspecified, uncomplicated: Secondary | ICD-10-CM | POA: Insufficient documentation

## 2015-05-07 DIAGNOSIS — M50322 Other cervical disc degeneration at C5-C6 level: Secondary | ICD-10-CM | POA: Insufficient documentation

## 2015-05-07 DIAGNOSIS — Z5181 Encounter for therapeutic drug level monitoring: Secondary | ICD-10-CM

## 2015-05-07 DIAGNOSIS — G5601 Carpal tunnel syndrome, right upper limb: Secondary | ICD-10-CM | POA: Diagnosis not present

## 2015-05-07 DIAGNOSIS — G8929 Other chronic pain: Secondary | ICD-10-CM | POA: Diagnosis not present

## 2015-05-07 DIAGNOSIS — Z89202 Acquired absence of left upper limb, unspecified level: Secondary | ICD-10-CM | POA: Diagnosis not present

## 2015-05-07 DIAGNOSIS — K219 Gastro-esophageal reflux disease without esophagitis: Secondary | ICD-10-CM | POA: Diagnosis not present

## 2015-05-07 DIAGNOSIS — M47816 Spondylosis without myelopathy or radiculopathy, lumbar region: Secondary | ICD-10-CM | POA: Insufficient documentation

## 2015-05-07 DIAGNOSIS — Z79899 Other long term (current) drug therapy: Secondary | ICD-10-CM | POA: Diagnosis not present

## 2015-05-07 DIAGNOSIS — M79603 Pain in arm, unspecified: Secondary | ICD-10-CM | POA: Diagnosis present

## 2015-05-07 DIAGNOSIS — J4520 Mild intermittent asthma, uncomplicated: Secondary | ICD-10-CM | POA: Diagnosis not present

## 2015-05-07 DIAGNOSIS — M62838 Other muscle spasm: Secondary | ICD-10-CM

## 2015-05-07 MED ORDER — MELOXICAM 15 MG PO TABS: 15.0000 mg | ORAL_TABLET | Freq: Every day | ORAL | Status: DC

## 2015-05-07 MED ORDER — OXYCODONE HCL 10 MG PO TABS: 10.0000 mg | ORAL_TABLET | Freq: Every day | ORAL | Status: DC | PRN

## 2015-05-07 MED ORDER — BACLOFEN 10 MG PO TABS: 10.0000 mg | ORAL_TABLET | Freq: Four times a day (QID) | ORAL | Status: DC

## 2015-05-07 MED ORDER — GABAPENTIN 600 MG PO TABS: 600.0000 mg | ORAL_TABLET | Freq: Three times a day (TID) | ORAL | Status: DC

## 2015-05-07 NOTE — Progress Notes (Signed)
Patient's Name: Melvin Brown MRN: [id number] DOB: May 19, 1975 DOS: 05/07/2015  Primary Reason(s) for Visit: Encounter for Medication Management CC: Arm Pain   HPI  Mr. Suhr is a 40 y.o. year old, male patient, who returns today as an established patient. He has Chronic pain syndrome; Environmental allergies; GERD (gastroesophageal reflux disease); Peripheral edema; Chronic pain associated with significant psychosocial dysfunction; Asthma, mild intermittent; Overweight; Abnormal toxicological findings; Disorder of rotator cuff; Allergic rhinitis, seasonal; Hypothyroidism; Elevated liver enzymes; Polysubstance abuse; Chronic pain; Long term current use of opiate analgesic; Long term prescription opiate use; Opiate use; Right arm pain; Traumatic amputation of left arm (HCC) (DOI: 10/26/1998); Phantom limb syndrome with pain (HCC) (left upper extremity); Muscle spasticity; Right forearm pain (Plate and screw fixation of right radius and ulnar shaft fractures; 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); Other shoulder lesions, right shoulder; Chronic right shoulder pain; Carpal tunnel syndrome (right side); Encounter for therapeutic drug level monitoring; Substance use disorder: High (abnormal 02/19/2015 UDS with unexpected fentanyl and buprenorphine metabolites found); Bronchial asthma; Essential hypertension; Chronic low back pain; Lumbar spondylosis (Old distracted right L5 transverse process fracture.); Class I Morbid obesity (HCC) (68% higher risk of back problems); Cocaine use; Chronic pain of right upper extremity; Chronic neck pain; Chronic pain of right hand (Mild right thumb metacarpophalangeal joint osteoarthrosis.); Chronic pain of right elbow (Chronic fractures and fusion radial and ulnar shaft fractures; evidence of hardware loosening and fracture nonunion of the ulnar fracture.); and Cervical spondylosis (degenerative change involving the uncovertebral joint on the right at C5-6.) on his problem list..  His primarily concern today is the Arm Pain   The patient returns to the clinic today for pharmacological management of his chronic pain. His injury was 16 years ago. He is still taking pain medications for this and he has had 2 abnormal UDS results. We did give him a final warning with regards to this on 03/19/2015. That same day we also ordered another urine drug screen test that for some reason leaked in transit and we never got the results. Today we are repeating this test and he still wonders the same final warning. He indicates that he has seen the orthopedic surgeons from the [facility], [facility], and [facility], all of which have told him that there is nothing that they can do for him. Previously he was going to [facility] but he stopped doing that because he said that it was too far. However, at this point there were the only was that were doing something for him and therefore he wants to go back. We'll put the referral for that.  Reported Pain Score: 6 , clinically he looks like a 1-2/10. Reported level is inconsistent with clinical obrservations. Pain Type: Chronic pain Pain Location: Arm Pain Orientation: Right Pain Descriptors / Indicators: Burning, Tingling, Constant Pain Frequency: Constant  Date of Last Visit: 03/19/15 Service Provided on Last Visit: Med Refill  Pharmacotherapy  Medication(s): He is taking oxycodone 10 mg 1 tablet by mouth 5 times a day. Onset of action: Within expected pharmacological parameters Time to Peak effect: Timing and results are as within normal expected parameters Analgesic Effect: More than 50% Activity Facilitation: Medication(s) allow patient to sit, stand, walk, and do the basic ADLs Perceived Effectiveness: Described as relatively effective, allowing for increase in activities of daily living (ADL) Side-effects or Adverse reactions: None reported Duration of action: Within normal limits for medication [place] PMP:  Compliant with practice rules and regulations. No anomalies found  UDS Results: His last UDS was done on 02/19/2015 and that one came back positive for fentanyl and buprenorphine as unexpected substances. They UDS ordered on 03/16/2015 leaked on transit. Today were repeated in the sample UDS Interpretation: Abnormal results discussed with patient Medication Assessment Form: Reviewed. Patient indicates being compliant with therapy Treatment compliance: Deficiencies noted and steps taken to remind the patient of the seriousness of adequate therapy compliance Substance Use Disorder (SUD) Risk Level: High Pharmacologic Plan: Continue therapy as is  Lab Work: Illicit Drugs Lab Results  Component Value Date   THCU NONE DETECTED 02/04/2015   PCPSCRNUR NONE DETECTED 02/04/2015   MDMA NONE DETECTED 02/04/2015   AMPHETMU NONE DETECTED 02/04/2015   METHADONE NONE DETECTED 02/04/2015    Inflammation Markers No results found for: ESRSEDRATE, CRP  Renal Function Lab Results  Component Value Date   BUN 8 02/04/2015   CREATININE 0.87 02/04/2015   GFRAA >60 02/04/2015   GFRNONAA >60 02/04/2015    Hepatic Function Lab Results  Component Value Date   AST 119* 02/04/2015   ALT 87* 02/04/2015   ALBUMIN 3.9 02/04/2015    Electrolytes Lab Results  Component Value Date   NA 142 02/04/2015   K 3.4* 02/04/2015   CL 106 02/04/2015   CALCIUM 8.5* 02/04/2015    Allergies  Mr. Shell has No Known Allergies.  Meds  The patient has a current medication list which includes the following prescription(s): albuterol, baclofen, gabapentin, ibuprofen, lansoprazole, meloxicam, omeprazole, oxycodone hcl, and pantoprazole.  Current Outpatient Prescriptions on File Prior to Visit  Medication Sig  . albuterol (PROVENTIL HFA;VENTOLIN HFA) 108 (90 BASE) MCG/ACT inhaler Inhale 1-2 puffs into the lungs every 6 (six) hours as needed for wheezing or shortness of breath.  . lansoprazole (PREVACID) 15 MG capsule  Take 15 mg by mouth as needed.  . pantoprazole (PROTONIX) 40 MG tablet Take 40 mg by mouth 2 (two) times daily.   No current facility-administered medications on file prior to visit.    ROS  Constitutional: Afebrile, no chills, well hydrated and well nourished Gastrointestinal: negative Musculoskeletal:negative Neurological: negative Behavioral/Psych: negative  PFSH  Medical:  Mr. Rafalski  has a past medical history of GERD (gastroesophageal reflux disease); Asthma; Arthritis; Frequent headaches; Torn rotator cuff (2002); Diabetes mellitus without complication (HCC); Chronic pain; Head injury; and Traumatic amputation of left arm (HCC) (10/26/1998) (02/06/2015). Family: family history includes Arthritis in his maternal grandfather and paternal grandmother; Cancer in his maternal grandfather; Heart attack in his paternal grandfather; SIDS in his daughter. There is no history of Diabetes, Hearing loss, or Stroke. Surgical:  has past surgical history that includes Arm amputation (Left); Pelvic fracture surgery; and ARM RECONSTRUCTION (Right). Tobacco:  reports that he has never smoked. He has never used smokeless tobacco. Alcohol:  reports that he does not drink alcohol. Drug:  reports that he does not use illicit drugs.  Physical Exam  Vitals:  Today's Vitals   05/07/15 0822 05/07/15 0823  BP: 158/108   Pulse: 92   Temp: 97.9 F (36.6 C)   Resp: 16   Height:  (1.753 m)   Weight: 215 lb (97.523 kg)   SpO2: 100%   PainSc: 6  6   PainLoc: Arm     Calculated BMI: Body mass index is 31.74 kg/(m^2).  General appearance: alert, cooperative, appears stated age and no distress Eyes: PERLA Respiratory: No evidence respiratory distress, no audible rales or ronchi and no use of accessory muscles of respiration  Cervical Spine  Inspection: Normal anatomy Alignment: Symetrical ROM: Adequate  Upper Extremities Inspection: The patient is missing his left upper extremity from the  traumatic amputation. On the right upper extremity he has evidence of significant scars and decreased range of motion from multiple surgeries in the forearm. ROM: Decreased Sensory: Normal Motor: Weakness Pulses: Palpable  Thoracic Spine Inspection: No gross anomalies detected Alignment: Symetrical ROM: Adequate Palpation: WNL  Lumbar Spine Inspection: No gross anomalies detected Alignment: Symetrical ROM: Adequate Gait: WNL  Lower Extremities Inspection: No gross anomalies detected ROM: Adequate Sensory:  Normal Motor: Unremarkable  Toe walk (S1): WNL  Heal walk (L5): WNL  Assessment & Plan  Primary Diagnosis & Pertinent Problem List: The primary encounter diagnosis was Chronic pain. Diagnoses of Long term current use of opiate analgesic, Encounter for therapeutic drug level monitoring, Chronic pain of right elbow (Chronic fractures and fusion radial and ulnar shaft fractures; evidence of hardware loosening and fracture nonunion of the ulnar fracture.), Phantom limb syndrome with pain (HCC) (left upper extremity), and Muscle spasticity were also pertinent to this visit.  Visit Diagnosis: 1. Chronic pain   2. Long term current use of opiate analgesic   3. Encounter for therapeutic drug level monitoring   4. Chronic pain of right elbow (Chronic fractures and fusion radial and ulnar shaft fractures; evidence of hardware loosening and fracture nonunion of the ulnar fracture.)   5. Phantom limb syndrome with pain (HCC) (left upper extremity)   6. Muscle spasticity     Assessment: No problem-specific assessment & plan notes found for this encounter.   Plan of Care  Pharmacotherapy (Medications Ordered): Meds ordered this encounter  Medications  . meloxicam (MOBIC) 15 MG tablet    Sig: Take 1 tablet (15 mg total) by mouth daily.    Dispense:  30 tablet    Refill:  0    Do not fill until 03/08/15.  Marland Kitchen Oxycodone HCl 10 MG TABS    Sig: Take 1 tablet (10 mg total) by mouth 5  (five) times daily as needed.    Dispense:  150 tablet    Refill:  0    Do not place this medication, or any other prescription from our practice, on "Automatic Refill". Patient may have prescription filled one day early if pharmacy is closed on scheduled refill date. Do not fill until: 05/07/15 To last until: 06/05/15  . gabapentin (NEURONTIN) 600 MG tablet    Sig: Take 1 tablet (600 mg total) by mouth every 8 (eight) hours.    Dispense:  120 tablet    Refill:  0    Do not fill until 03/08/15.  . baclofen (LIORESAL) 10 MG tablet    Sig: Take 1 tablet (10 mg total) by mouth 4 (four) times daily.    Dispense:  120 each    Refill:  0    Do not fill until 03/08/15.    Lab-work & Procedure Ordered: Orders Placed This Encounter  Procedures  . Ambulatory referral to Orthopedic Surgery    Referral Priority:  Routine    Referral Type:  Surgical    Referral Reason:  Specialty Services Required    Requested Specialty:  Orthopedic Surgery    Number of Visits Requested:  1    Imaging Ordered: AMB REFERRAL TO ORTHOPEDIC SURGERY  Interventional Therapies: Scheduled: None at this time PRN Procedures: None at this time    Referral(s) or Consult(s): [facility] for evaluation of right arm problems.  Medications administered during this visit: Mr. Harlin  had no medications administered during this visit.  Future Appointments Date Time Provider Department Center  06/05/2015 1:00 PM Delano Metz, MD [facility] None    Primary Care Physician: Olevia Perches, DO Location: [facility] Facility Note by: Sydnee Levans. Laban Emperor, M.D, DABA, DABAPM, DABPM, DABIPP, FIPP

## 2015-05-07 NOTE — Progress Notes (Signed)
Safety precautions to be maintained throughout the outpatient stay will include: orient to surroundings, keep bed in low position, maintain call bell within reach at all times, provide assistance with transfer out of bed and ambulation. Oxycodone pill count # 0 

## 2015-05-12 LAB — TOXASSURE SELECT 13 (MW), URINE: PDF: 0

## 2015-05-14 ENCOUNTER — Ambulatory Visit: Payer: Self-pay | Admitting: Family Medicine

## 2015-05-19 ENCOUNTER — Encounter: Payer: Self-pay | Admitting: Pain Medicine

## 2015-05-19 NOTE — Progress Notes (Signed)
Quick Note:  The findings of this UDT were reported as abnormal due to inconsistencies with expected results. An unreported substance was identified in the sample. Expectations were based on the medication history provided by the patient at the time of sample collection. These results are of concern due to the following possibilities: 1. The use of multiple providers, suggesting the illegal practice of "Doctor Shopping", in violation of our medication agreement. 2. The use of unsanctioned and possibly illegal substances.  Due to persistent abnormal UDS tests, such as: 05/27/2013 with Cocaine; 02/19/2015 with unexpected fentanyl and buprenorphine metabolites; 05/07/15 with unreported Hydrocodone & no Oxycodone metabolites, we will no longer continue to prescribe any controlled substances to this patient. ______

## 2015-06-01 ENCOUNTER — Telehealth: Payer: Self-pay | Admitting: Family Medicine

## 2015-06-01 DIAGNOSIS — G894 Chronic pain syndrome: Secondary | ICD-10-CM

## 2015-06-01 NOTE — Telephone Encounter (Signed)
Patient called stating that he wants a referral to a pain doctor in [facility] because he is having a lot of pain on his arm. Patient states that he only has one arm and cannot stand the pain. Please call patient. [phone], thanks.

## 2015-06-01 NOTE — Telephone Encounter (Signed)
Referral to pain management made. If needs to discuss further, please have him make an appointment.

## 2015-06-01 NOTE — Telephone Encounter (Signed)
Patient called.

## 2015-06-01 NOTE — Telephone Encounter (Signed)
Routed to MJ 

## 2015-06-05 ENCOUNTER — Encounter: Payer: Medicare Other | Admitting: Pain Medicine

## 2015-06-18 ENCOUNTER — Encounter: Payer: Medicare Other | Admitting: Pain Medicine

## 2015-06-20 ENCOUNTER — Encounter: Payer: Medicare Other | Admitting: Pain Medicine

## 2015-06-25 ENCOUNTER — Encounter: Payer: Medicare Other | Admitting: Pain Medicine

## 2015-07-05 ENCOUNTER — Encounter: Payer: Medicare Other | Admitting: Pain Medicine

## 2015-07-31 DIAGNOSIS — G894 Chronic pain syndrome: Secondary | ICD-10-CM | POA: Diagnosis not present

## 2015-08-02 DIAGNOSIS — M8900 Algoneurodystrophy, unspecified site: Secondary | ICD-10-CM | POA: Diagnosis not present

## 2015-08-02 DIAGNOSIS — S5291XS Unspecified fracture of right forearm, sequela: Secondary | ICD-10-CM | POA: Diagnosis not present

## 2015-08-13 DIAGNOSIS — F1120 Opioid dependence, uncomplicated: Secondary | ICD-10-CM | POA: Diagnosis not present

## 2015-10-04 DIAGNOSIS — G546 Phantom limb syndrome with pain: Secondary | ICD-10-CM | POA: Diagnosis not present

## 2015-10-04 DIAGNOSIS — J4520 Mild intermittent asthma, uncomplicated: Secondary | ICD-10-CM | POA: Diagnosis not present

## 2015-10-04 DIAGNOSIS — G5600 Carpal tunnel syndrome, unspecified upper limb: Secondary | ICD-10-CM | POA: Diagnosis not present

## 2015-10-04 DIAGNOSIS — R7989 Other specified abnormal findings of blood chemistry: Secondary | ICD-10-CM | POA: Diagnosis not present

## 2015-10-04 DIAGNOSIS — G894 Chronic pain syndrome: Secondary | ICD-10-CM | POA: Diagnosis not present

## 2015-10-17 DIAGNOSIS — K219 Gastro-esophageal reflux disease without esophagitis: Secondary | ICD-10-CM | POA: Diagnosis not present

## 2015-10-17 DIAGNOSIS — M79601 Pain in right arm: Secondary | ICD-10-CM | POA: Diagnosis not present

## 2015-10-17 DIAGNOSIS — E669 Obesity, unspecified: Secondary | ICD-10-CM | POA: Diagnosis not present

## 2015-10-17 DIAGNOSIS — J4530 Mild persistent asthma, uncomplicated: Secondary | ICD-10-CM | POA: Diagnosis not present

## 2015-10-17 DIAGNOSIS — G894 Chronic pain syndrome: Secondary | ICD-10-CM | POA: Diagnosis not present

## 2015-10-18 ENCOUNTER — Telehealth: Payer: Self-pay | Admitting: Family Medicine

## 2015-10-18 NOTE — Telephone Encounter (Signed)
erroneous

## 2016-07-17 ENCOUNTER — Telehealth: Payer: Self-pay

## 2016-07-17 NOTE — Telephone Encounter (Signed)
Attempted to reach to schedule for Medicare Wellness. Message left for pt to return call.   

## 2016-10-04 ENCOUNTER — Encounter: Payer: Self-pay | Admitting: Emergency Medicine

## 2016-10-04 ENCOUNTER — Emergency Department
Admission: EM | Admit: 2016-10-04 | Discharge: 2016-10-04 | Disposition: A | Discharge status: Home / Self Care | Payer: Medicare Other | Attending: Emergency Medicine | Admitting: Emergency Medicine

## 2016-10-04 DIAGNOSIS — K2100 Gastro-esophageal reflux disease with esophagitis, without bleeding: Secondary | ICD-10-CM

## 2016-10-04 DIAGNOSIS — E079 Disorder of thyroid, unspecified: Secondary | ICD-10-CM | POA: Diagnosis not present

## 2016-10-04 DIAGNOSIS — J45909 Unspecified asthma, uncomplicated: Secondary | ICD-10-CM | POA: Diagnosis not present

## 2016-10-04 DIAGNOSIS — E119 Type 2 diabetes mellitus without complications: Secondary | ICD-10-CM | POA: Diagnosis not present

## 2016-10-04 DIAGNOSIS — K219 Gastro-esophageal reflux disease without esophagitis: Secondary | ICD-10-CM | POA: Diagnosis present

## 2016-10-04 DIAGNOSIS — Z79899 Other long term (current) drug therapy: Secondary | ICD-10-CM | POA: Insufficient documentation

## 2016-10-04 MED ORDER — ALBUTEROL SULFATE HFA 108 (90 BASE) MCG/ACT IN AERS: 2.0000 | INHALATION_SPRAY | Freq: Four times a day (QID) | RESPIRATORY_TRACT | 2 refills | Status: DC | PRN

## 2016-10-04 MED ORDER — ESOMEPRAZOLE MAGNESIUM 40 MG PO CPDR: 40.0000 mg | DELAYED_RELEASE_CAPSULE | Freq: Every day | ORAL | 1 refills | Status: DC

## 2016-10-04 NOTE — ED Provider Notes (Signed)
[facility] Emergency Department Provider Note   ____________________________________________   First MD Initiated Contact with Patient 10/04/16 1253     (approximate)  I have reviewed the triage vital signs and the nursing notes.   HISTORY  Chief Complaint Gastroesophageal Reflux   HPI Melvin Brown is a 41 y.o. male who presents to the emergency department for evaluation of acid reflux and asthma. Patient states that he was taking Nexium which was controlling his reflux very well, but due to some insurance changes and no change in primary care providers he has been unable to get a new prescription for the Nexium. He states that he has been taking Prilosec and Prevacid over-the-counter, which has not provided adequate relief. Patient describes a tight feeling in his chest and epigastric area that seems to be triggered by food, cough, and possibly his asthma. This is not relieved with use of his pro-air. He states that he is using his pro air 6-8 times per day for this chest tightness, but denies wheezing or shortness of breath during these episodes.   Past Medical History:  Diagnosis Date  . Arthritis   . Asthma   . Chronic pain   . Diabetes mellitus without complication (HCC)   . Frequent headaches   . GERD (gastroesophageal reflux disease)   . Head injury   . Torn rotator cuff 2002   right  . Traumatic amputation of left arm (HCC) (10/26/1998) 02/06/2015    Patient Active Problem List   Diagnosis Date Noted  . Cocaine use 02/20/2015  . Chronic pain of right upper extremity 02/20/2015  . Chronic neck pain 02/20/2015  . Chronic pain of right hand (Mild right thumb metacarpophalangeal joint osteoarthrosis.) 02/20/2015  . Chronic pain of right elbow (Chronic fractures and fusion radial and ulnar shaft fractures; evidence of hardware loosening and fracture nonunion of the ulnar fracture.) 02/20/2015  . Cervical spondylosis (degenerative change  involving the uncovertebral joint on the right at C5-6.) 02/20/2015  . Substance use disorder: High (Abnormal UDS: 05/27/2013 with Cocaine; 02/19/2015 with unexpected fentanyl and buprenorphine metabolites; 05/07/15 with unreported Hydrocodone & no Oxycodone metabolites) 02/07/2015  . Bronchial asthma 02/07/2015  . Essential hypertension 02/07/2015  . Chronic low back pain 02/07/2015  . Lumbar spondylosis (Old distracted right L5 transverse process fracture.) 02/07/2015  . Class I Morbid obesity (HCC) (68% higher risk of back problems) 02/07/2015  . Chronic pain 02/06/2015  . Long term current use of opiate analgesic 02/06/2015  . Long term prescription opiate use 02/06/2015  . Opiate use 02/06/2015  . Right arm pain 02/06/2015  . Traumatic amputation of left arm (HCC) (DOI: 10/26/1998) 02/06/2015  . Phantom limb syndrome with pain (HCC) (left upper extremity) 02/06/2015  . Muscle spasticity 02/06/2015  . 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 Chronic right shoulder pain 02/06/2015  . Carpal tunnel syndrome (right side) 02/06/2015  . Encounter for therapeutic drug level monitoring 02/06/2015  . Polysubstance abuse   . Other shoulder lesions, right shoulder 11/10/2014  . Hypothyroidism 10/13/2014  . Elevated liver enzymes 10/13/2014  . Peripheral edema 10/12/2014  . Chronic pain associated with significant psychosocial dysfunction 10/12/2014  . Disorder of rotator cuff 10/12/2014  . Chronic pain syndrome 09/08/2013  . Environmental allergies 09/08/2013  . GERD (gastroesophageal reflux disease) 09/08/2013  . Abnormal toxicological findings 08/07/2013  . Overweight 05/11/2013  . Asthma, mild intermittent 02/11/2013  .  Allergic rhinitis, seasonal 08/12/2012    Past Surgical History:  Procedure Laterality Date  . ARM AMPUTATION Left   . ARM RECONSTRUCTION Right    X 5  . PELVIC  FRACTURE SURGERY      Prior to Admission medications   Medication Sig Start Date End Date Taking? Authorizing Provider  albuterol (PROVENTIL HFA;VENTOLIN HFA) 108 (90 Base) MCG/ACT inhaler Inhale 2 puffs into the lungs every 6 (six) hours as needed for wheezing or shortness of breath. 10/04/16   Deion Swift B, FNP  baclofen (LIORESAL) 10 MG tablet Take 1 tablet (10 mg total) by mouth 4 (four) times daily. 05/07/15   Delano MetzNaveira, Francisco, MD  esomeprazole (NEXIUM) 40 MG capsule Take 1 capsule (40 mg total) by mouth daily. 10/04/16 10/04/17  Renada Cronin, Rulon Eisenmengerari B, FNP  gabapentin (NEURONTIN) 600 MG tablet Take 1 tablet (600 mg total) by mouth every 8 (eight) hours. 05/07/15   Delano MetzNaveira, Francisco, MD  ibuprofen (ADVIL,MOTRIN) 800 MG tablet 800 mg as needed.  01/28/12   [provider]  lansoprazole (PREVACID) 15 MG capsule Take 15 mg by mouth as needed.    [provider]  meloxicam (MOBIC) 15 MG tablet Take 1 tablet (15 mg total) by mouth daily. 05/07/15   Delano MetzNaveira, Francisco, MD  omeprazole (PRILOSEC) 20 MG capsule TAKE 1 CAPSULE (20 MG TOTAL) BY MOUTH ONCE DAILY. 02/16/15   [provider]  Oxycodone HCl 10 MG TABS Take 1 tablet (10 mg total) by mouth 5 (five) times daily as needed. 05/07/15   Delano MetzNaveira, Francisco, MD    Allergies Patient has no known allergies.  Family History  Problem Relation Age of Onset  . Arthritis Maternal Grandfather   . Cancer Maternal Grandfather        Colon  . Arthritis Paternal Grandmother   . Heart attack Paternal Grandfather   . SIDS Daughter   . Diabetes Neg Hx   . Hearing loss Neg Hx   . Stroke Neg Hx     Social History Social History  Substance Use Topics  . Smoking status: Never Smoker  . Smokeless tobacco: Never Used  . Alcohol use No    Review of Systems  Constitutional: No fever/chills ENT: No sore throat. Cardiovascular: Positive for midsternal chest "tightness." Respiratory: Denies shortness of breath.    Gastrointestinal: Positive for epigastric pain  No nausea, no vomiting. Positive for water brash, especially at night Musculoskeletal: Negative for back pain. Skin: Negative for rash. Neurological: Negative for headaches, focal weakness or numbness.   ____________________________________________   PHYSICAL EXAM:  VITAL SIGNS: ED Triage Vitals  Enc Vitals Group     BP 10/04/16 1234 (!) 142/99     Pulse Rate 10/04/16 1234 83     Resp 10/04/16 1234 20     Temp 10/04/16 1234 97.9 F (36.6 C)     Temp Source 10/04/16 1234 Oral     SpO2 10/04/16 1234 97 %     Weight 10/04/16 1235 185 lb (83.9 kg)     Height 10/04/16 1235 5\' 9"  (1.753 m)     Head Circumference --      Peak Flow --      Pain Score --      Pain Loc --      Pain Edu? --      Excl. in GC? --     Constitutional: Alert and oriented. Well appearing and in no acute distress. Eyes: Conjunctivae are normal.  Head: Atraumatic. Nose: No congestion/rhinnorhea. Mouth/Throat: Mucous membranes  are moist.  Oropharynx non-erythematous. Cobblestone appearance on the posterior pharynx Neck: No stridor.   Cardiovascular: Normal rate, regular rhythm. Grossly normal heart sounds.  Good peripheral circulation. Respiratory: Normal respiratory effort.  No retractions. Lungs CTAB. Gastrointestinal: Soft and nontender to palpation. Bowel sounds present and active 4 quadrants. No distention. No abdominal bruits. No CVA tenderness. Musculoskeletal: No lower extremity tenderness nor edema.  No joint effusions. Neurologic:  Normal speech and language. No gross focal neurologic deficits are appreciated. No gait instability. Skin:  Skin is warm, dry and intact. No rash noted. Psychiatric: Mood and affect are normal. Speech and behavior are normal.  ____________________________________________   LABS (all labs ordered are listed, but only abnormal results are displayed)  Labs Reviewed - No data to  display ____________________________________________  EKG  ED ECG REPORT I, Kem Boroughs, the nurse practitioner, personally viewed and interpreted this ECG.   Date: 10/04/2016  EKG Time: 12:30 PM  Rate: 74  Rhythm: normal EKG, normal sinus rhythm, no previous tracings sound, incomplete right bundle branch block.  Axis: Normal  Intervals:none  ST&T Change: Nonspecific as T and T-wave abnormality  ____________________________________________  RADIOLOGY  No results found.  ____________________________________________   PROCEDURES  Procedure(s) performed: None  Procedures  Critical Care performed: No  ____________________________________________   INITIAL IMPRESSION / ASSESSMENT AND PLAN / ED COURSE  Pertinent labs & imaging results that were available during my care of the patient were reviewed by me and considered in my medical decision making (see chart for details).  41 year old male presenting to the emergency department requesting prescriptions for Nexium and albuterol. He reports that these symptoms are identical and consistent with previous diagnosis of gastritis esophageal reflux disease for which Nexium worked well. He will be prescribed 40 mg daily for this month and encouraged to schedule a follow-up appointment with either his primary care provider or schedule with a different provider. Ultimately, he was encouraged to see a gastroenterologist but states that his insurance is required of prior authorization for referral from primary care. Vital signs and EKG are both reassuring. Strict return precautions were also discussed and he was encouraged to return to the emergency department for any symptom of concern if unable to schedule an appointment with his primary care provider. ____________________________________________   FINAL CLINICAL IMPRESSION(S) / ED DIAGNOSES  Final diagnoses:  Gastroesophageal reflux disease with esophagitis      NEW  MEDICATIONS STARTED DURING THIS VISIT:  Discharge Medication List as of 10/04/2016  1:32 PM    START taking these medications   Details  esomeprazole (NEXIUM) 40 MG capsule Take 1 capsule (40 mg total) by mouth daily., Starting Sat 10/04/2016, Until Sun 10/04/2017, Print         Note:  This document was prepared using Dragon voice recognition software and may include unintentional dictation errors.    Chinita Pester, FNP 10/04/16 1414    Governor Rooks, MD 10/04/16 [phone]

## 2016-10-04 NOTE — Discharge Instructions (Signed)
Please follow-up with your primary care provider or establish care closer to home and schedule an appointment. Take the Nexium as prescribed. Use the inhaler as prescribed. Return to the emergency department for symptoms that change or worsen if you are unable schedule an appointment.

## 2016-10-04 NOTE — ED Notes (Signed)
Pt reports history of acid reflux reports he has been coughing a lot due to his asthma and it has exacerbated his GERD reports vomited this morning due to cough and has epigastric discomfort, reports his asthma medicine running out" if I can get another one because this one does not seem to work"

## 2016-10-04 NOTE — ED Triage Notes (Addendum)
Pt presents to ED c/o gastric reflux. Pt denies SOB or chest pain. States he has been prescribed nexium in the past that worked well but has not been to see PCP in a year. Has been taking OTC prevacid and prilosec without relief.

## 2016-11-07 ENCOUNTER — Ambulatory Visit: Payer: Medicare Other | Admitting: Family Medicine

## 2016-12-05 ENCOUNTER — Telehealth: Payer: Self-pay | Admitting: Family Medicine

## 2016-12-05 NOTE — Telephone Encounter (Signed)
Patient needs to be scheduled, he has not been seen in over two years.

## 2016-12-05 NOTE — Telephone Encounter (Signed)
Pt would like a refill for his inhaler and nexium sent to [facility].

## 2016-12-09 ENCOUNTER — Ambulatory Visit (INDEPENDENT_AMBULATORY_CARE_PROVIDER_SITE_OTHER): Payer: Medicare Other | Admitting: Family Medicine

## 2016-12-09 VITALS — BP 137/94 | HR 72 | Temp 99.4°F | Ht 68.5 in | Wt 194.0 lb

## 2016-12-09 DIAGNOSIS — J302 Other seasonal allergic rhinitis: Secondary | ICD-10-CM

## 2016-12-09 DIAGNOSIS — J4520 Mild intermittent asthma, uncomplicated: Secondary | ICD-10-CM | POA: Diagnosis not present

## 2016-12-09 DIAGNOSIS — K219 Gastro-esophageal reflux disease without esophagitis: Secondary | ICD-10-CM

## 2016-12-09 MED ORDER — RANITIDINE HCL 150 MG PO TABS: 150.0000 mg | ORAL_TABLET | Freq: Two times a day (BID) | ORAL | 3 refills | Status: DC

## 2016-12-09 MED ORDER — ALBUTEROL SULFATE HFA 108 (90 BASE) MCG/ACT IN AERS: 2.0000 | INHALATION_SPRAY | Freq: Four times a day (QID) | RESPIRATORY_TRACT | 6 refills | Status: DC | PRN

## 2016-12-09 MED ORDER — DEXLANSOPRAZOLE 60 MG PO CPDR: 60.0000 mg | DELAYED_RELEASE_CAPSULE | Freq: Every day | ORAL | 0 refills | Status: DC

## 2016-12-09 MED ORDER — FLUTICASONE-SALMETEROL 250-50 MCG/DOSE IN AEPB: 1.0000 | INHALATION_SPRAY | Freq: Two times a day (BID) | RESPIRATORY_TRACT | 3 refills | Status: DC

## 2016-12-09 NOTE — Assessment & Plan Note (Signed)
Will switch to dexilant and add zantac BID prn. Reiterated lifestyle modifications, weight loss, avoiding irritating medicines such as NSAIDs. Will refer back to GI if no relief with this regimen

## 2016-12-09 NOTE — Assessment & Plan Note (Signed)
Currently taking claritin daily. Recommended adding flonase.

## 2016-12-09 NOTE — Assessment & Plan Note (Signed)
Will restart advair, reviewed rinsing mouth well after every use. Continue albuterol prn

## 2016-12-09 NOTE — Patient Instructions (Signed)
Follow up in 3 months

## 2016-12-09 NOTE — Progress Notes (Signed)
BP (!) 137/94   Pulse 72   Temp 99.4 F (37.4 C)   Ht 5' 8.5" (1.74 m)   Wt 194 lb (88 kg)   SpO2 97%   BMI 29.07 kg/m    Subjective:    Patient ID: Melvin Brown, male    DOB: 05/04/1975, 41 y.o.   MRN: [id number]  HPI: Melvin Brown is a 41 y.o. male  Chief Complaint  Patient presents with  . Gastroesophageal Reflux    been worse lately, constantly switches between all 4 meds depending on if he has a refill for them or gets them OTC.    Patient presents today after being lost to f/u for more than 2 years for worsening GERD sxs x 2 weeks. States his sxs wax and wane in general, but consistently worse lately. Currently taking nexium 40 mg daily for his reflux. Has also been on prevacid, protonix, and prilosec with no relief. Has had GI workup which he states noted scarring in the esophagus but no other abnormalities. Sleeps propped up, takes medicine on empty stomach, tries to avoid foods he knows to be triggers.  Having asthma flares about 2-3 times per day. This is classically his worst allergy season. Was doing well on advair a year or two ago but since running out has only been doing albuterol prn. Having wheezing, chest tightness, SOB.   Past Medical History:  Diagnosis Date  . Allergic rhinitis   . Arthritis   . Asthma   . Carpal tunnel syndrome   . Cervicalgia   . Chronic pain   . Diabetes mellitus without complication (HCC)   . Frequent headaches   . GERD (gastroesophageal reflux disease)   . Head injury   . History of cocaine abuse   . Hypothyroidism   . Low back pain   . Migraines   . OA (osteoarthritis)    right hand  . Psychoactive substance abuse   . Spondylosis of cervical region without myelopathy or radiculopathy   . Spondylosis of lumbosacral region   . Torn rotator cuff 2002   right  . Traumatic amputation of left arm (HCC) (10/26/1998) 02/06/2015   Social History   Social History  . Marital status: Single    Spouse name: N/A  . Number of  children: N/A  . Years of education: N/A   Occupational History  . Not on file.   Social History Main Topics  . Smoking status: Never Smoker  . Smokeless tobacco: Never Used  . Alcohol use No  . Drug use: No  . Sexual activity: Yes   Other Topics Concern  . Not on file   Social History Narrative  . No narrative on file   Relevant past medical, surgical, family and social history reviewed and updated as indicated. Interim medical history since our last visit reviewed. Allergies and medications reviewed and updated.  Review of Systems  Constitutional: Negative.   HENT: Negative.  Negative for sore throat.   Eyes: Negative.   Respiratory: Positive for chest tightness, shortness of breath and wheezing.   Cardiovascular: Negative.   Gastrointestinal:       Reflux  Musculoskeletal: Negative.   Neurological: Negative.   Psychiatric/Behavioral: Negative.    Per HPI unless specifically indicated above     Objective:    BP (!) 137/94   Pulse 72   Temp 99.4 F (37.4 C)   Ht 5' 8.5" (1.74 m)   Wt 194 lb (88 kg)   SpO2  97%   BMI 29.07 kg/m   Wt Readings from Last 3 Encounters:  12/09/16 194 lb (88 kg)  10/04/16 185 lb (83.9 kg)  05/07/15 215 lb (97.5 kg)    Physical Exam  Constitutional: He is oriented to person, place, and time. He appears well-developed and well-nourished. No distress.  HENT:  Head: Atraumatic.  Eyes: Pupils are equal, round, and reactive to light. Conjunctivae are normal.  Neck: Normal range of motion. Neck supple.  Cardiovascular: Normal rate and normal heart sounds.   Pulmonary/Chest: Effort normal. No respiratory distress. He has no wheezes.  Musculoskeletal: Normal range of motion.  Neurological: He is alert and oriented to person, place, and time.  Skin: Skin is warm and dry.  Psychiatric: He has a normal mood and affect. His behavior is normal.  Nursing note and vitals reviewed.     Assessment & Plan:   Problem List Items Addressed  This Visit      Respiratory   Asthma, mild intermittent - Primary    Will restart advair, reviewed rinsing mouth well after every use. Continue albuterol prn      Relevant Medications   albuterol (PROVENTIL HFA;VENTOLIN HFA) 108 (90 Base) MCG/ACT inhaler   Fluticasone-Salmeterol (ADVAIR DISKUS) 250-50 MCG/DOSE AEPB   Allergic rhinitis, seasonal    Currently taking claritin daily. Recommended adding flonase.         Digestive   GERD (gastroesophageal reflux disease)    Will switch to dexilant and add zantac BID prn. Reiterated lifestyle modifications, weight loss, avoiding irritating medicines such as NSAIDs. Will refer back to GI if no relief with this regimen      Relevant Medications   pantoprazole (PROTONIX) 40 MG tablet   dexlansoprazole (DEXILANT) 60 MG capsule   ranitidine (ZANTAC) 150 MG tablet       Follow up plan: Return in about 3 months (around 03/10/2017) for GERD, asthma f/u.

## 2016-12-16 ENCOUNTER — Other Ambulatory Visit: Payer: Self-pay | Admitting: Family Medicine

## 2016-12-17 MED ORDER — ESOMEPRAZOLE MAGNESIUM 40 MG PO CPDR: 40.0000 mg | DELAYED_RELEASE_CAPSULE | Freq: Every day | ORAL | 1 refills | Status: DC

## 2016-12-17 NOTE — Telephone Encounter (Signed)
Refill changed and sent

## 2016-12-17 NOTE — Telephone Encounter (Signed)
Routing to provider. Can we please change the prescription to normal instead of print so it will go straight to the pharmacy? [facility].

## 2017-01-05 ENCOUNTER — Telehealth: Payer: Self-pay | Admitting: Family Medicine

## 2017-01-05 NOTE — Telephone Encounter (Signed)
He does not need a new rx for the Nexium. He has a refill on file at the pharmacy.

## 2017-01-05 NOTE — Telephone Encounter (Signed)
Patient requesting a refill on medication for Nexium. Handicap placard ran out. Patient needing to fill out handicap placard form. Patient will come to office 01/05/2017 to fill out form. Patient aware of how the process works.   Please Advise.  Thank you

## 2017-01-06 ENCOUNTER — Telehealth: Payer: Self-pay | Admitting: Family Medicine

## 2017-01-06 NOTE — Telephone Encounter (Signed)
Patient filled out DMV form and will pick up when signed  Thanks  [phone]

## 2017-01-06 NOTE — Telephone Encounter (Signed)
Patient will need an appointment, he has not been seen in over a year.

## 2017-01-29 ENCOUNTER — Telehealth: Payer: Self-pay

## 2017-01-29 NOTE — Telephone Encounter (Signed)
Called to schedule medicare wellness visit [facility]. Spoke with patients wife. She will have [name] call office back as he is not home at the moment.

## 2017-02-01 ENCOUNTER — Other Ambulatory Visit: Payer: Self-pay | Admitting: Family Medicine

## 2017-02-12 ENCOUNTER — Telehealth: Payer: Self-pay

## 2017-02-12 MED ORDER — ESOMEPRAZOLE MAGNESIUM 40 MG PO CPDR: 40.0000 mg | DELAYED_RELEASE_CAPSULE | Freq: Every day | ORAL | 1 refills | Status: DC

## 2017-02-12 NOTE — Telephone Encounter (Signed)
[facility] sent a fax requesting a 90 day RX of patient's esomeprazole be sent in instead of a 30 day supply.

## 2017-02-12 NOTE — Telephone Encounter (Signed)
Rx modified

## 2017-03-06 ENCOUNTER — Other Ambulatory Visit: Payer: Self-pay | Admitting: Family Medicine

## 2017-03-10 ENCOUNTER — Ambulatory Visit (INDEPENDENT_AMBULATORY_CARE_PROVIDER_SITE_OTHER): Payer: Medicare Other | Admitting: Family Medicine

## 2017-03-10 ENCOUNTER — Encounter: Payer: Self-pay | Admitting: Family Medicine

## 2017-03-10 ENCOUNTER — Ambulatory Visit: Payer: Self-pay | Admitting: Family Medicine

## 2017-03-10 VITALS — BP 125/86 | HR 86 | Temp 98.2°F | Wt 196.7 lb

## 2017-03-10 DIAGNOSIS — J4520 Mild intermittent asthma, uncomplicated: Secondary | ICD-10-CM

## 2017-03-10 DIAGNOSIS — J302 Other seasonal allergic rhinitis: Secondary | ICD-10-CM | POA: Diagnosis not present

## 2017-03-10 DIAGNOSIS — K219 Gastro-esophageal reflux disease without esophagitis: Secondary | ICD-10-CM

## 2017-03-10 MED ORDER — RANITIDINE HCL 150 MG PO TABS: 150.0000 mg | ORAL_TABLET | Freq: Two times a day (BID) | ORAL | 3 refills | Status: DC

## 2017-03-10 MED ORDER — DEXLANSOPRAZOLE 60 MG PO CPDR: 60.0000 mg | DELAYED_RELEASE_CAPSULE | Freq: Every day | ORAL | 3 refills | Status: DC

## 2017-03-10 MED ORDER — FLUTICASONE-SALMETEROL 250-50 MCG/DOSE IN AEPB: 1.0000 | INHALATION_SPRAY | Freq: Two times a day (BID) | RESPIRATORY_TRACT | 11 refills | Status: DC

## 2017-03-10 MED ORDER — LORATADINE 10 MG PO CAPS: 10.0000 mg | ORAL_CAPSULE | Freq: Every day | ORAL | 3 refills | Status: DC | PRN

## 2017-03-10 MED ORDER — FLUTICASONE PROPIONATE 50 MCG/ACT NA SUSP: 2.0000 | Freq: Every day | NASAL | 11 refills | Status: DC

## 2017-03-10 MED ORDER — ALBUTEROL SULFATE HFA 108 (90 BASE) MCG/ACT IN AERS: 2.0000 | INHALATION_SPRAY | Freq: Four times a day (QID) | RESPIRATORY_TRACT | 6 refills | Status: DC | PRN

## 2017-03-10 NOTE — Assessment & Plan Note (Signed)
Much improved with dexilant, continue dexilant and zantac regimen as well as lifestyle modifications

## 2017-03-10 NOTE — Progress Notes (Signed)
BP 125/86 (BP Location: Right Arm, Patient Position: Sitting, Cuff Size: Normal)   Pulse 86   Temp 98.2 F (36.8 C) (Oral)   Wt 196 lb 11.2 oz (89.2 kg)   SpO2 99%   BMI 29.47 kg/m    Subjective:    Patient ID: Melvin Brown, male    DOB: 1975-04-15, 41 y.o.   MRN: [id number]  HPI: Melvin Brown is a 41 y.o. male  Chief Complaint  Patient presents with  . Follow-up    3 month follow-up. Patient states he's better. No complaints.  . Medication Refill  . Gastroesophageal Reflux  . Asthma   Patient presents today for 3 month f/u for uncontrolled asthma and GERD.  Restarted advair BID in addition to the albuterol prn with excellent results. Previously was having 2-3 flares daily, now 1-2 monthly. Also increased allergy regimen to claritin and flonase. No daily wheezing, SOB. States biggest trigger is cold weather.   Doing much better on dexilant and zantac regimen. Has failed all other PPIs in the past, dexilant works the best for him. Having the occasional bad day with breakthrough sxs but notes they are few and far between.   Past Medical History:  Diagnosis Date  . Allergic rhinitis   . Arthritis   . Asthma   . Carpal tunnel syndrome   . Cervicalgia   . Chronic pain   . Diabetes mellitus without complication (HCC)   . Frequent headaches   . GERD (gastroesophageal reflux disease)   . Head injury   . History of cocaine abuse   . Hypothyroidism   . Low back pain   . Migraines   . OA (osteoarthritis)    right hand  . Psychoactive substance abuse (HCC)   . Spondylosis of cervical region without myelopathy or radiculopathy   . Spondylosis of lumbosacral region   . Torn rotator cuff 2002   right  . Traumatic amputation of left arm (HCC) (10/26/1998) 02/06/2015   Social History   Socioeconomic History  . Marital status: Single    Spouse name: Not on file  . Number of children: Not on file  . Years of education: Not on file  . Highest education level: Not on  file  Social Needs  . Financial resource strain: Not on file  . Food insecurity - worry: Not on file  . Food insecurity - inability: Not on file  . Transportation needs - medical: Not on file  . Transportation needs - non-medical: Not on file  Occupational History  . Not on file  Tobacco Use  . Smoking status: Never Smoker  . Smokeless tobacco: Never Used  Substance and Sexual Activity  . Alcohol use: No  . Drug use: No  . Sexual activity: Yes  Other Topics Concern  . Not on file  Social History Narrative  . Not on file    Relevant past medical, surgical, family and social history reviewed and updated as indicated. Interim medical history since our last visit reviewed. Allergies and medications reviewed and updated.  Review of Systems  Constitutional: Negative.   HENT: Negative.   Eyes: Negative.   Respiratory: Negative.   Cardiovascular: Negative.   Gastrointestinal: Negative.   Musculoskeletal: Negative.   Skin: Negative.   Neurological: Negative.   Psychiatric/Behavioral: Negative.    Per HPI unless specifically indicated above     Objective:    BP 125/86 (BP Location: Right Arm, Patient Position: Sitting, Cuff Size: Normal)   Pulse 86  Temp 98.2 F (36.8 C) (Oral)   Wt 196 lb 11.2 oz (89.2 kg)   SpO2 99%   BMI 29.47 kg/m   Wt Readings from Last 3 Encounters:  03/10/17 196 lb 11.2 oz (89.2 kg)  12/09/16 194 lb (88 kg)  10/04/16 185 lb (83.9 kg)    Physical Exam  Constitutional: He is oriented to person, place, and time. He appears well-developed and well-nourished. No distress.  HENT:  Head: Atraumatic.  Eyes: Conjunctivae are normal. Pupils are equal, round, and reactive to light. No scleral icterus.  Neck: Normal range of motion. Neck supple.  Cardiovascular: Normal rate and normal heart sounds.  Pulmonary/Chest: Effort normal and breath sounds normal. No respiratory distress. He has no wheezes.  Abdominal: Soft. Bowel sounds are normal. There is  no tenderness.  Musculoskeletal: Normal range of motion.  Neurological: He is alert and oriented to person, place, and time.  Skin: Skin is warm and dry.  Psychiatric: He has a normal mood and affect. His behavior is normal.  Nursing note and vitals reviewed.     Assessment & Plan:   Problem List Items Addressed This Visit      Respiratory   Asthma, mild intermittent    Improved with re-addition of advair. Continue current regimen      Relevant Medications   albuterol (PROVENTIL HFA;VENTOLIN HFA) 108 (90 Base) MCG/ACT inhaler   Fluticasone-Salmeterol (ADVAIR DISKUS) 250-50 MCG/DOSE AEPB   Allergic rhinitis, seasonal - Primary    Good symptomatic improvement adding flonase to daily claritin regimen. Continue current regimen        Digestive   GERD (gastroesophageal reflux disease)    Much improved with dexilant, continue dexilant and zantac regimen as well as lifestyle modifications      Relevant Medications   ranitidine (ZANTAC) 150 MG tablet   dexlansoprazole (DEXILANT) 60 MG capsule       Follow up plan: Return in about 6 months (around 09/08/2017) for CPE.

## 2017-03-10 NOTE — Patient Instructions (Signed)
Follow up for CPE 

## 2017-03-10 NOTE — Assessment & Plan Note (Signed)
Good symptomatic improvement adding flonase to daily claritin regimen. Continue current regimen

## 2017-03-10 NOTE — Assessment & Plan Note (Signed)
Improved with re-addition of advair. Continue current regimen

## 2017-05-12 ENCOUNTER — Other Ambulatory Visit: Payer: Self-pay

## 2017-05-12 ENCOUNTER — Emergency Department
Admission: EM | Admit: 2017-05-12 | Discharge: 2017-05-13 | Disposition: A | Discharge status: Home / Self Care | Payer: Medicare Other | Attending: Emergency Medicine | Admitting: Emergency Medicine

## 2017-05-12 DIAGNOSIS — T40601A Poisoning by unspecified narcotics, accidental (unintentional), initial encounter: Secondary | ICD-10-CM | POA: Diagnosis not present

## 2017-05-12 DIAGNOSIS — E119 Type 2 diabetes mellitus without complications: Secondary | ICD-10-CM | POA: Insufficient documentation

## 2017-05-12 DIAGNOSIS — T402X4A Poisoning by other opioids, undetermined, initial encounter: Secondary | ICD-10-CM | POA: Diagnosis not present

## 2017-05-12 DIAGNOSIS — E039 Hypothyroidism, unspecified: Secondary | ICD-10-CM | POA: Diagnosis not present

## 2017-05-12 DIAGNOSIS — R55 Syncope and collapse: Secondary | ICD-10-CM | POA: Diagnosis present

## 2017-05-12 DIAGNOSIS — Z79899 Other long term (current) drug therapy: Secondary | ICD-10-CM | POA: Diagnosis not present

## 2017-05-12 DIAGNOSIS — T40604A Poisoning by unspecified narcotics, undetermined, initial encounter: Secondary | ICD-10-CM

## 2017-05-12 DIAGNOSIS — I10 Essential (primary) hypertension: Secondary | ICD-10-CM | POA: Diagnosis not present

## 2017-05-12 DIAGNOSIS — T50904A Poisoning by unspecified drugs, medicaments and biological substances, undetermined, initial encounter: Secondary | ICD-10-CM | POA: Diagnosis not present

## 2017-05-12 DIAGNOSIS — J45909 Unspecified asthma, uncomplicated: Secondary | ICD-10-CM | POA: Diagnosis not present

## 2017-05-12 LAB — COMPREHENSIVE METABOLIC PANEL
ALK PHOS: 97 U/L (ref 38–126)
ALT: 20 U/L (ref 17–63)
AST: 59 U/L — AB (ref 15–41)
Albumin: 3.1 g/dL — ABNORMAL LOW (ref 3.5–5.0)
Anion gap: 15 (ref 5–15)
BILIRUBIN TOTAL: 1.5 mg/dL — AB (ref 0.3–1.2)
BUN: 7 mg/dL (ref 6–20)
CO2: 21 mmol/L — ABNORMAL LOW (ref 22–32)
CREATININE: 0.79 mg/dL (ref 0.61–1.24)
Calcium: 8.6 mg/dL — ABNORMAL LOW (ref 8.9–10.3)
Chloride: 100 mmol/L — ABNORMAL LOW (ref 101–111)
GFR calc Af Amer: >60 mL/min (ref >60)
GFR calc non Af Amer: >60 mL/min (ref >60)
Glucose, Bld: 125 mg/dL — ABNORMAL HIGH (ref 65–99)
Potassium: 3.5 mmol/L (ref 3.5–5.1)
Sodium: 136 mmol/L (ref 135–145)
TOTAL PROTEIN: 8.6 g/dL — AB (ref 6.5–8.1)

## 2017-05-12 LAB — ACETAMINOPHEN LEVEL: Acetaminophen (Tylenol), Serum: <10 ug/mL — ABNORMAL LOW (ref 10–30)

## 2017-05-12 LAB — URINE DRUG SCREEN, QUALITATIVE (ARMC ONLY)
Amphetamines, Ur Screen: NOT DETECTED
BENZODIAZEPINE, UR SCRN: POSITIVE — AB
Barbiturates, Ur Screen: NOT DETECTED
CANNABINOID 50 NG, UR ~~LOC~~: NOT DETECTED
Cocaine Metabolite,Ur ~~LOC~~: NOT DETECTED
MDMA (ECSTASY) UR SCREEN: NOT DETECTED
Methadone Scn, Ur: NOT DETECTED
OPIATE, UR SCREEN: NOT DETECTED
PHENCYCLIDINE (PCP) UR S: NOT DETECTED
Tricyclic, Ur Screen: NOT DETECTED

## 2017-05-12 LAB — CBC
HEMATOCRIT: 42.1 % (ref 40.0–52.0)
Hemoglobin: 14.3 g/dL (ref 13.0–18.0)
MCH: 32.4 pg (ref 26.0–34.0)
MCHC: 34 g/dL (ref 32.0–36.0)
MCV: 95.1 fL (ref 80.0–100.0)
Platelets: 282 10*3/uL (ref 150–440)
RBC: 4.43 MIL/uL (ref 4.40–5.90)
RDW: 14.2 % (ref 11.5–14.5)
WBC: 6.6 10*3/uL (ref 3.8–10.6)

## 2017-05-12 LAB — SALICYLATE LEVEL: Salicylate Lvl: <7 mg/dL (ref 2.8–30.0)

## 2017-05-12 LAB — ETHANOL: Alcohol, Ethyl (B): 23 mg/dL — ABNORMAL HIGH (ref <10)

## 2017-05-12 MED ORDER — PANTOPRAZOLE SODIUM 40 MG PO TBEC: 40.0000 mg | DELAYED_RELEASE_TABLET | Freq: Every day | ORAL | Status: DC

## 2017-05-12 MED ORDER — NALOXONE HCL 4 MG/0.1ML NA LIQD
1.0000 | Freq: Once | NASAL | Status: AC
  Administered 2017-05-12: 1 via NASAL
  Filled 2017-05-12: qty 4

## 2017-05-12 NOTE — ED Triage Notes (Signed)
He arrives today via [facility] from his home - pt was found outside his home lying beside his car unresponsive - 4mg  intranasal narcan administered by EMS - pt became more responsive    Arrives here ambulatory with a steady gait - alert and oriented  Pt reports he took 3 10 mg oxycontin  Denies heroin use   Released from jail - 3 hours ago  Reports that his father passed away yesterday due to an MI

## 2017-05-12 NOTE — ED Provider Notes (Addendum)
[facility] Emergency Department Provider Note  ____________________________________________  Time seen: Approximately 7:50 PM  I have reviewed the triage vital signs and the nursing notes.   HISTORY  Chief Complaint Drug Overdose   HPI Melvin Brown is a 42 y.o. male with a history of polysubstance abuse who presents after drug overdose. Patient was arrested yesterday after the police found heroin and narcotics in his apartment. Patient was released from jail at [facility] this afternoon. He was found unresponsive by his car outside of his apartment. Police were the first one at the scene and gave patient 4 mg of IN narcan and patient regained consciousness immediately. The patient tells me that he took 3 x 32m oxycontin. He denies suicidal intent. He denies any co-ingestions. He denies depression. Patient has a h/o opiate abuse. Patient IVC'ed by police.  Chief Complaint: overdose Severity: severe Duration: unknown Timing: just PTA Context: after taking 3 x 121moxycontin Associated signs/symptoms: unresponsive, not breathing    Past Medical History:  Diagnosis Date  . Allergic rhinitis   . Arthritis   . Asthma   . Carpal tunnel syndrome   . Cervicalgia   . Chronic pain   . Diabetes mellitus without complication (HCBreese  . Frequent headaches   . GERD (gastroesophageal reflux disease)   . Head injury   . History of cocaine abuse   . Hypothyroidism   . Low back pain   . Migraines   . OA (osteoarthritis)    right hand  . Psychoactive substance abuse (HCAthens  . Spondylosis of cervical region without myelopathy or radiculopathy   . Spondylosis of lumbosacral region   . Torn rotator cuff 2002   right  . Traumatic amputation of left arm (HCMan[place](10/26/1998) 02/06/2015    Patient Active Problem List   Diagnosis Date Noted  . Cocaine use 02/20/2015  . Chronic pain of right upper extremity 02/20/2015  . Chronic neck pain 02/20/2015  . Chronic pain of  right hand (Mild right thumb metacarpophalangeal joint osteoarthrosis.) 02/20/2015  . Chronic pain of right elbow (Chronic fractures and fusion radial and ulnar shaft fractures; evidence of hardware loosening and fracture nonunion of the ulnar fracture.) 02/20/2015  . Cervical spondylosis (degenerative change involving the uncovertebral joint on the right at C5-6.) 02/20/2015  . Substance use disorder: High (Abnormal UDS: 05/27/2013 with Cocaine; 02/19/2015 with unexpected fentanyl and buprenorphine metabolites; 05/07/15 with unreported Hydrocodone & no Oxycodone metabolites) 02/07/2015  . Bronchial asthma 02/07/2015  . Essential hypertension 02/07/2015  . Chronic low back pain 02/07/2015  . Lumbar spondylosis (Old distracted right L5 transverse process fracture.) 02/07/2015  . Class I Morbid obesity (HCBelleair Shore(68% higher risk of back problems) 02/07/2015  . Chronic pain 02/06/2015  . Long term current use of opiate analgesic 02/06/2015  . Long term prescription opiate use 02/06/2015  . Opiate use 02/06/2015  . Right arm pain 02/06/2015  . Traumatic amputation of left arm (HCFayette[place](DOI: 10/26/1998) 02/06/2015  . Phantom limb syndrome with pain (HCParker(left upper extremity) 02/06/2015  . Muscle spasticity 02/06/2015  . 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 Chronic right shoulder pain 02/06/2015  . Carpal tunnel syndrome (right side) 02/06/2015  . Encounter for therapeutic drug level monitoring 02/06/2015  . Polysubstance abuse (HCDublin  . Other shoulder lesions, right shoulder 11/10/2014  . Hypothyroidism 10/13/2014  . Elevated liver enzymes 10/13/2014  .  Peripheral edema 10/12/2014  . Chronic pain associated with significant psychosocial dysfunction 10/12/2014  . Disorder of rotator cuff 10/12/2014  . Chronic pain syndrome 09/08/2013  . Environmental allergies 09/08/2013  . GERD (gastroesophageal  reflux disease) 09/08/2013  . Abnormal toxicological findings 08/07/2013  . Overweight 05/11/2013  . Asthma, mild intermittent 02/11/2013  . Allergic rhinitis, seasonal 08/12/2012    Past Surgical History:  Procedure Laterality Date  . ARM AMPUTATION Left   . ARM RECONSTRUCTION Right    X 5  . PELVIC FRACTURE SURGERY    . WISDOM TOOTH EXTRACTION      Prior to Admission medications   Medication Sig Start Date End Date Taking? Authorizing Provider  albuterol (PROVENTIL HFA;VENTOLIN HFA) 108 (90 Base) MCG/ACT inhaler Inhale 2 puffs into the lungs every 6 (six) hours as needed for wheezing or shortness of breath. 03/10/17   Volney American, PA-C  calcitonin, salmon, (MIACALCIN/FORTICAL) 200 UNIT/ACT nasal spray Place into the nose. 07/31/15   [provider]  dexlansoprazole (DEXILANT) 60 MG capsule Take 1 capsule (60 mg total) by mouth daily. 03/10/17   Volney American, PA-C  fluticasone [facility]) 50 MCG/ACT nasal spray Place 2 sprays into both nostrils daily. 03/10/17   Volney American, PA-C  Fluticasone-Salmeterol (ADVAIR DISKUS) 250-50 MCG/DOSE AEPB Inhale 1 puff into the lungs 2 (two) times daily. 03/10/17   Volney American, PA-C  gabapentin (NEURONTIN) 800 MG tablet Take 800 mg by mouth 3 (three) times daily. 08/04/16   [provider]  ibuprofen (ADVIL,MOTRIN) 800 MG tablet 800 mg as needed.  01/28/12   [provider]  levothyroxine (SYNTHROID, LEVOTHROID) 25 MCG tablet Take 25 mcg by mouth daily. 10/13/14   [provider]  Loratadine 10 MG CAPS Take 1 capsule (10 mg total) by mouth daily as needed. 03/10/17   Volney American, PA-C  meloxicam (MOBIC) 15 MG tablet Take 1 tablet (15 mg total) by mouth daily. 05/07/15   Milinda Pointer, MD  ranitidine (ZANTAC) 150 MG tablet Take 1 tablet (150 mg total) by mouth 2 (two) times daily. 03/10/17   Volney American, PA-C    Allergies Patient has no known  allergies.  Family History  Problem Relation Age of Onset  . Arthritis Maternal Grandfather   . Cancer Maternal Grandfather        Colon, prostate  . Hypertension Father   . CAD Maternal Grandmother   . Arthritis Paternal Grandmother   . Hypertension Paternal Grandmother   . Stroke Paternal Grandmother   . Heart attack Paternal Grandfather   . CAD Paternal Grandfather   . SIDS Daughter   . Diabetes Neg Hx   . Hearing loss Neg Hx     Social History Social History   Tobacco Use  . Smoking status: Never Smoker  . Smokeless tobacco: Never Used  Substance Use Topics  . Alcohol use: No  . Drug use: Yes    Review of Systems  Constitutional: Negative for fever.  Eyes: Negative for visual changes. ENT: Negative for sore throat. Neck: No neck pain  Cardiovascular: Negative for chest pain. Respiratory: Negative for shortness of breath. Gastrointestinal: Negative for abdominal pain, vomiting or diarrhea. Genitourinary: Negative for dysuria. Musculoskeletal: Negative for back pain. Skin: Negative for rash. Neurological: Negative for headaches, weakness or numbness. Psych: No SI or HI. + overdose  ____________________________________________   PHYSICAL EXAM:  VITAL SIGNS: ED Triage Vitals  Enc Vitals Group     BP 05/12/17 1914 126/68  Pulse Rate 05/12/17 1914 (!) 113     Resp 05/12/17 1914 18     Temp 05/12/17 1914 98.3 F (36.8 C)     Temp Source 05/12/17 1914 Oral     SpO2 05/12/17 1914 97 %     Weight 05/12/17 1907 190 lb (86.2 kg)     Height 05/12/17 1907 '5\' 9"'  (1.753 m)     Head Circumference --      Peak Flow --      Pain Score 05/12/17 1906 9     Pain Loc --      Pain Edu? --      Excl. in [place]? --     Constitutional: Alert and oriented. Well appearing and in no apparent distress. HEENT:      Head: Normocephalic and atraumatic.         Eyes: Conjunctivae are normal. Sclera is non-icteric.       Mouth/Throat: Mucous membranes are moist.       Neck:  Supple with no signs of meningismus. Cardiovascular: Regular rate and rhythm. No murmurs, gallops, or rubs. 2+ symmetrical distal pulses are present in all extremities. No JVD. Respiratory: Normal respiratory effort. Lungs are clear to auscultation bilaterally. No wheezes, crackles, or rhonchi.  Gastrointestinal: Soft, non tender, and non distended with positive bowel sounds. No rebound or guarding. Musculoskeletal: Nontender with normal range of motion in all extremities. No edema, cyanosis, or erythema of extremities. Neurologic: Normal speech and language. Face is symmetric. Moving all extremities. No gross focal neurologic deficits are appreciated. Skin: Skin is warm, dry and intact. No rash noted. Psychiatric: Mood and affect are normal. Speech and behavior are normal.  ____________________________________________   LABS (all labs ordered are listed, but only abnormal results are displayed)  Labs Reviewed  COMPREHENSIVE METABOLIC PANEL - Abnormal; Notable for the following components:      Result Value   Chloride 100 (*)    CO2 21 (*)    Glucose, Bld 125 (*)    Calcium 8.6 (*)    Total Protein 8.6 (*)    Albumin 3.1 (*)    AST 59 (*)    Total Bilirubin 1.5 (*)    All other components within normal limits  ETHANOL - Abnormal; Notable for the following components:   Alcohol, Ethyl (B) 23 (*)    All other components within normal limits  ACETAMINOPHEN LEVEL - Abnormal; Notable for the following components:   Acetaminophen (Tylenol), Serum <10 (*)    All other components within normal limits  URINE DRUG SCREEN, QUALITATIVE ([facility] ONLY) - Abnormal; Notable for the following components:   Benzodiazepine, Ur Scrn POSITIVE (*)    All other components within normal limits  SALICYLATE LEVEL  CBC   ____________________________________________  EKG  ED ECG REPORT I, Rudene Re, the attending physician, personally viewed and interpreted this ECG.  Normal sinus rhythm, rate  of 97, normal intervals, normal axis, no ST elevations or depressions, T-wave inversion in lead V3 and inferior leads.  No significant changes when compared to prior ____________________________________________  RADIOLOGY  none  ____________________________________________   PROCEDURES  Procedure(s) performed: None Procedures Critical Care performed:  None ____________________________________________   INITIAL IMPRESSION / ASSESSMENT AND PLAN / ED COURSE  42 y.o. male with a history of polysubstance abuse who presents IVC by police for drug overdose. Patient denies suicidal or homicidal ideation. He does have a history of opiate abuse. He took 3 x 10 mg OxyContin. Patient is GCS of 15 and  hemodynamically stable after 4 mg of intranasal Narcan. Half life of oxycontin is about 4.5 hours. Will monitor patient for possible need to redose narcan. Psych consulted  Clinical Course as of May 12 2310  Tue May 12, 2017  2309 Patient has been cleared by psychiatry for discharge. I will provide him with narcan kit before dc. Patient will be dc to custody of police.  [CV]    Clinical Course User Index [CV] Alfred Levins, [place], MD   _________________________ 10:50 PM on 05/12/2017 -----------------------------------------  Patient has been monitored for 4 hours with no recurrence of his overdose. Patient is now medically cleared. Awaiting psychiatric evaluation.   As part of my medical decision making, I reviewed the following data within the [facility] notes reviewed and incorporated, Labs reviewed , EKG interpreted , Old EKG reviewed, A consult was requested and obtained from this/these consultant(s) psychiatry, Notes from prior ED visits and [place] Controlled Substance Database    Pertinent labs & imaging results that were available during my care of the patient were reviewed by me and considered in my medical decision making (see chart for  details).    ____________________________________________   FINAL CLINICAL IMPRESSION(S) / ED DIAGNOSES  Final diagnoses:  Opiate overdose, undetermined intent, initial encounter ([place])      NEW MEDICATIONS STARTED DURING THIS VISIT:  ED Discharge Orders    None       Note:  This document was prepared using Dragon voice recognition software and may include unintentional dictation errors.    Alfred Levins, [place], MD 05/12/17 [place], [place], MD 05/12/17 2312

## 2017-05-12 NOTE — ED Notes (Signed)
[facility] Computer is in room.

## 2017-05-13 NOTE — ED Notes (Signed)
Patient wheeled to lobby, taken into [employer] custody.

## 2017-07-03 IMAGING — CR DG CHEST 1V
1 series · 1 of 1 positions shown · non-contrast
Comparison: None.

CLINICAL DATA: Cough.  Overdose.

EXAM:
CHEST 1 VIEW

[portable]
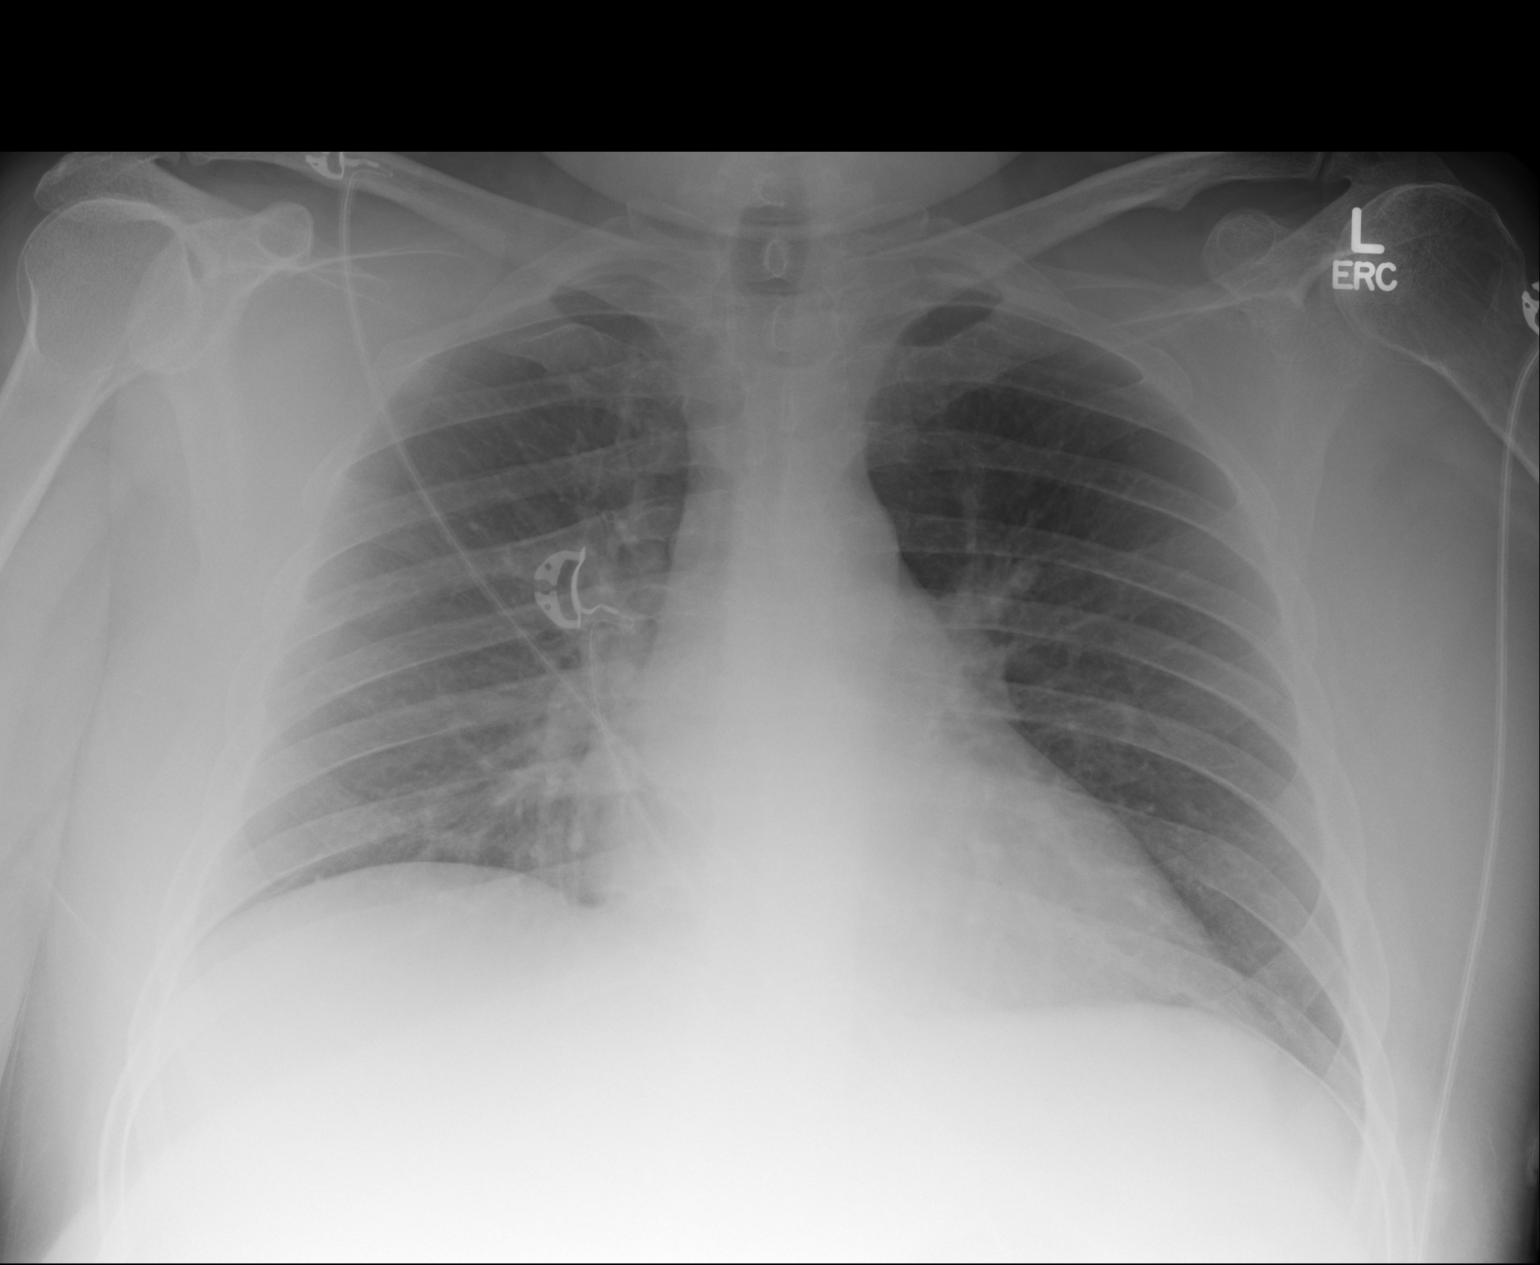

[1 of 1 positions shown; findings below may reference images not displayed]

FINDINGS: A single AP portable view of the chest demonstrates no focal
airspace consolidation or alveolar edema. The lungs are grossly
clear. There is no large effusion or pneumothorax. Cardiac and
mediastinal contours appear unremarkable.
IMPRESSION: No active disease.

## 2017-08-16 ENCOUNTER — Other Ambulatory Visit: Payer: Self-pay

## 2017-08-16 ENCOUNTER — Emergency Department: Payer: Medicare Other

## 2017-08-16 ENCOUNTER — Encounter: Payer: Self-pay | Admitting: Emergency Medicine

## 2017-08-16 ENCOUNTER — Emergency Department
Admission: EM | Admit: 2017-08-16 | Discharge: 2017-08-16 | Disposition: A | Discharge status: Home / Self Care | Payer: Medicare Other | Attending: Emergency Medicine | Admitting: Emergency Medicine

## 2017-08-16 DIAGNOSIS — E039 Hypothyroidism, unspecified: Secondary | ICD-10-CM | POA: Diagnosis not present

## 2017-08-16 DIAGNOSIS — J45909 Unspecified asthma, uncomplicated: Secondary | ICD-10-CM | POA: Insufficient documentation

## 2017-08-16 DIAGNOSIS — Z79899 Other long term (current) drug therapy: Secondary | ICD-10-CM | POA: Diagnosis not present

## 2017-08-16 DIAGNOSIS — I10 Essential (primary) hypertension: Secondary | ICD-10-CM | POA: Diagnosis not present

## 2017-08-16 DIAGNOSIS — T40601A Poisoning by unspecified narcotics, accidental (unintentional), initial encounter: Secondary | ICD-10-CM

## 2017-08-16 DIAGNOSIS — R05 Cough: Secondary | ICD-10-CM | POA: Diagnosis not present

## 2017-08-16 DIAGNOSIS — T50901A Poisoning by unspecified drugs, medicaments and biological substances, accidental (unintentional), initial encounter: Secondary | ICD-10-CM | POA: Diagnosis present

## 2017-08-16 DIAGNOSIS — T402X1A Poisoning by other opioids, accidental (unintentional), initial encounter: Secondary | ICD-10-CM | POA: Insufficient documentation

## 2017-08-16 DIAGNOSIS — T50904A Poisoning by unspecified drugs, medicaments and biological substances, undetermined, initial encounter: Secondary | ICD-10-CM | POA: Diagnosis not present

## 2017-08-16 LAB — COMPREHENSIVE METABOLIC PANEL
ALBUMIN: 4.3 g/dL (ref 3.5–5.0)
ALT: 263 U/L — ABNORMAL HIGH (ref 17–63)
ANION GAP: 12 (ref 5–15)
AST: 285 U/L — AB (ref 15–41)
Alkaline Phosphatase: 110 U/L (ref 38–126)
BUN: 14 mg/dL (ref 6–20)
CO2: 24 mmol/L (ref 22–32)
Calcium: 9 mg/dL (ref 8.9–10.3)
Chloride: 95 mmol/L — ABNORMAL LOW (ref 101–111)
Creatinine, Ser: 0.67 mg/dL (ref 0.61–1.24)
GFR calc Af Amer: >60 mL/min (ref >60)
GFR calc non Af Amer: >60 mL/min (ref >60)
GLUCOSE: 160 mg/dL — AB (ref 65–99)
POTASSIUM: 3.2 mmol/L — AB (ref 3.5–5.1)
SODIUM: 131 mmol/L — AB (ref 135–145)
TOTAL PROTEIN: 9.8 g/dL — AB (ref 6.5–8.1)
Total Bilirubin: 1.5 mg/dL — ABNORMAL HIGH (ref 0.3–1.2)

## 2017-08-16 LAB — CBC
HEMATOCRIT: 42.3 % (ref 40.0–52.0)
Hemoglobin: 14.6 g/dL (ref 13.0–18.0)
MCH: 31.5 pg (ref 26.0–34.0)
MCHC: 34.5 g/dL (ref 32.0–36.0)
MCV: 91.5 fL (ref 80.0–100.0)
Platelets: 205 10*3/uL (ref 150–440)
RBC: 4.62 MIL/uL (ref 4.40–5.90)
RDW: 16.5 % — AB (ref 11.5–14.5)
WBC: 8.6 10*3/uL (ref 3.8–10.6)

## 2017-08-16 LAB — TROPONIN I: Troponin I: <0.03 ng/mL (ref <0.03)

## 2017-08-16 MED ORDER — ACETAMINOPHEN 325 MG PO TABS
650.0000 mg | ORAL_TABLET | Freq: Once | ORAL | Status: AC
  Administered 2017-08-16: 650 mg via ORAL
  Filled 2017-08-16: qty 2

## 2017-08-16 NOTE — ED Notes (Signed)
Pt ambulated out of ED with steady gait prior to d/c. Dr. Cyril Loosen aware of pt elopement.

## 2017-08-16 NOTE — ED Notes (Signed)
Swallow screen complete and pt able to tolerate po fluids. Pt given gingerale. Awaiting dispo on pt, will continue to monitor.

## 2017-08-16 NOTE — ED Triage Notes (Signed)
Pt unresponsive in car. Per ems pt with respirations 6 bpm. Ems gave narcan IM x 2 and iv x 1 with good response from pt. Pt arrives here awake and alert. Denies substance use.

## 2017-08-16 NOTE — ED Provider Notes (Signed)
[facility] Emergency Department Provider Note   ____________________________________________    I have reviewed the triage vital signs and the nursing notes.   HISTORY  Chief Complaint Drug Overdose     HPI Melvin Brown is a 42 y.o. male who was apparently found unresponsive and cyanotic in his car.  He states that he was in the passenger seat waiting for his friend to go into the grocery store.  He thinks that he may have passed out.  He denies drug abuse to me.  EMS states that the patient had 2 syringes and a spoon in the car was cyanotic and revived rapidly with IV Narcan.  Patient denies chest pain or palpitations.  No shortness of breath.  Review of medical records demonstrate a history of polysubstance abuse   Past Medical History:  Diagnosis Date  . Allergic rhinitis   . Arthritis   . Asthma   . Carpal tunnel syndrome   . Cervicalgia   . Chronic pain   . Frequent headaches   . GERD (gastroesophageal reflux disease)   . Head injury   . History of cocaine abuse   . Hypothyroidism   . Low back pain   . Migraines   . OA (osteoarthritis)    right hand  . Psychoactive substance abuse (HCC)   . Spondylosis of cervical region without myelopathy or radiculopathy   . Spondylosis of lumbosacral region   . Torn rotator cuff 2002   right  . Traumatic amputation of left arm (HCC) (10/26/1998) 02/06/2015    Patient Active Problem List   Diagnosis Date Noted  . Cocaine use 02/20/2015  . Chronic pain of right upper extremity 02/20/2015  . Chronic neck pain 02/20/2015  . Chronic pain of right hand (Mild right thumb metacarpophalangeal joint osteoarthrosis.) 02/20/2015  . Chronic pain of right elbow (Chronic fractures and fusion radial and ulnar shaft fractures; evidence of hardware loosening and fracture nonunion of the ulnar fracture.) 02/20/2015  . Cervical spondylosis (degenerative change involving the uncovertebral joint on the right at  C5-6.) 02/20/2015  . Substance use disorder: High (Abnormal UDS: 05/27/2013 with Cocaine; 02/19/2015 with unexpected fentanyl and buprenorphine metabolites; 05/07/15 with unreported Hydrocodone & no Oxycodone metabolites) 02/07/2015  . Bronchial asthma 02/07/2015  . Essential hypertension 02/07/2015  . Chronic low back pain 02/07/2015  . Lumbar spondylosis (Old distracted right L5 transverse process fracture.) 02/07/2015  . Class I Morbid obesity (HCC) (68% higher risk of back problems) 02/07/2015  . Chronic pain 02/06/2015  . Long term current use of opiate analgesic 02/06/2015  . Long term prescription opiate use 02/06/2015  . Opiate use 02/06/2015  . Right arm pain 02/06/2015  . Traumatic amputation of left arm (HCC) (DOI: 10/26/1998) 02/06/2015  . Phantom limb syndrome with pain (HCC) (left upper extremity) 02/06/2015  . Muscle spasticity 02/06/2015  . 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 Chronic right shoulder pain 02/06/2015  . Carpal tunnel syndrome (right side) 02/06/2015  . Encounter for therapeutic drug level monitoring 02/06/2015  . Polysubstance abuse (HCC)   . Other shoulder lesions, right shoulder 11/10/2014  . Hypothyroidism 10/13/2014  . Elevated liver enzymes 10/13/2014  . Peripheral edema 10/12/2014  . Chronic pain associated with significant psychosocial dysfunction 10/12/2014  . Disorder of rotator cuff 10/12/2014  . Chronic pain syndrome 09/08/2013  . Environmental allergies 09/08/2013  . GERD (gastroesophageal reflux disease)  09/08/2013  . Abnormal toxicological findings 08/07/2013  . Overweight 05/11/2013  . Asthma, mild intermittent 02/11/2013  . Allergic rhinitis, seasonal 08/12/2012    Past Surgical History:  Procedure Laterality Date  . ARM AMPUTATION Left   . ARM RECONSTRUCTION Right    X 5  . PELVIC FRACTURE SURGERY    . WISDOM TOOTH EXTRACTION       Prior to Admission medications   Medication Sig Start Date End Date Taking? Authorizing Provider  albuterol (PROVENTIL HFA;VENTOLIN HFA) 108 (90 Base) MCG/ACT inhaler Inhale 2 puffs into the lungs every 6 (six) hours as needed for wheezing or shortness of breath. 03/10/17   Particia Nearing, PA-C  calcitonin, salmon, (MIACALCIN/FORTICAL) 200 UNIT/ACT nasal spray Place into the nose. 07/31/15   [provider]  dexlansoprazole (DEXILANT) 60 MG capsule Take 1 capsule (60 mg total) by mouth daily. 03/10/17   Particia Nearing, PA-C  fluticasone [facility]) 50 MCG/ACT nasal spray Place 2 sprays into both nostrils daily. 03/10/17   Particia Nearing, PA-C  Fluticasone-Salmeterol (ADVAIR DISKUS) 250-50 MCG/DOSE AEPB Inhale 1 puff into the lungs 2 (two) times daily. 03/10/17   Particia Nearing, PA-C  gabapentin (NEURONTIN) 800 MG tablet Take 800 mg by mouth 3 (three) times daily. 08/04/16   [provider]  ibuprofen (ADVIL,MOTRIN) 800 MG tablet 800 mg as needed.  01/28/12   [provider]  levothyroxine (SYNTHROID, LEVOTHROID) 25 MCG tablet Take 25 mcg by mouth daily. 10/13/14   [provider]  Loratadine 10 MG CAPS Take 1 capsule (10 mg total) by mouth daily as needed. 03/10/17   Particia Nearing, PA-C  meloxicam (MOBIC) 15 MG tablet Take 1 tablet (15 mg total) by mouth daily. 05/07/15   Delano Metz, MD  ranitidine (ZANTAC) 150 MG tablet Take 1 tablet (150 mg total) by mouth 2 (two) times daily. 03/10/17   Particia Nearing, PA-C     Allergies Patient has no known allergies.  Family History  Problem Relation Age of Onset  . Arthritis Maternal Grandfather   . Cancer Maternal Grandfather        Colon, prostate  . Hypertension Father   . CAD Maternal Grandmother   . Arthritis Paternal Grandmother   . Hypertension Paternal Grandmother   . Stroke Paternal Grandmother   . Heart attack Paternal Grandfather   . CAD Paternal  Grandfather   . SIDS Daughter   . Diabetes Neg Hx   . Hearing loss Neg Hx     Social History Social History   Tobacco Use  . Smoking status: Never Smoker  . Smokeless tobacco: Never Used  Substance Use Topics  . Alcohol use: Yes  . Drug use: Yes    Review of Systems  Constitutional: No fever/chills Eyes: No visual changes.  ENT: No sore throat. Cardiovascular: Denies chest pain. Respiratory: Denies shortness of breath. Gastrointestinal: No abdominal pain.  No nausea, no vomiting.   Genitourinary: Negative for dysuria. Musculoskeletal: Negative for back pain. Skin: Negative for rash. Neurological: Negative for headaches or weakness   ____________________________________________   PHYSICAL EXAM:  VITAL SIGNS: ED Triage Vitals  Enc Vitals Group     BP 08/16/17 1037 (!) 165/105     Pulse Rate 08/16/17 1037 97     Resp --      Temp 08/16/17 1037 98 F (36.7 C)     Temp Source 08/16/17 1037 Oral     SpO2 --      Weight 08/16/17 1038 88.5  kg (195 lb)     Height 08/16/17 1038 1.753 m ( )     Head Circumference --      Peak Flow --      Pain Score 08/16/17 1038 0     Pain Loc --      Pain Edu? --      Excl. in [place]? --     Constitutional: Alert and oriented. No acute distress.  Eyes: Conjunctivae are normal.   Nose: No congestion/rhinnorhea. Mouth/Throat: Mucous membranes are moist.   Neck:  Painless ROM Cardiovascular: Normal rate, regular rhythm. Grossly normal heart sounds.  Good peripheral circulation. Respiratory: Normal respiratory effort.  No retractions.  Clear to auscultation bilaterally Gastrointestinal: Soft and nontender. No distention. Musculoskeletal: No lower extremity tenderness nor edema.  Warm and well perfused Neurologic:  Normal speech and language. No gross focal neurologic deficits are appreciated.  Skin:  Skin is warm, dry and intact. No rash noted. Psychiatric: Mood and affect are normal. Speech and behavior are  normal.  ____________________________________________   LABS (all labs ordered are listed, but only abnormal results are displayed)  Labs Reviewed  CBC - Abnormal; Notable for the following components:      Result Value   RDW 16.5 (*)    All other components within normal limits  COMPREHENSIVE METABOLIC PANEL - Abnormal; Notable for the following components:   Sodium 131 (*)    Potassium 3.2 (*)    Chloride 95 (*)    Glucose, Bld 160 (*)    Total Protein 9.8 (*)    AST 285 (*)    ALT 263 (*)    Total Bilirubin 1.5 (*)    All other components within normal limits  TROPONIN I  URINE DRUG SCREEN, QUALITATIVE ([facility] ONLY)   ____________________________________________  EKG  None ____________________________________________  RADIOLOGY  Chest x-ray normal ____________________________________________   PROCEDURES  Procedure(s) performed: No  Procedures   Critical Care performed: No ____________________________________________   INITIAL IMPRESSION / ASSESSMENT AND PLAN / ED COURSE  Pertinent labs & imaging results that were available during my care of the patient were reviewed by me and considered in my medical decision making (see chart for details).  Patient well-appearing in no acute distress.  Presentation is extraordinary suspicious for heroin overdose.  EMS reports the patient had small bleeding puncture in his right [facility] prior to their trying to place an IV and also note drug paraphernalia.  They also pointed out that he rapidly improved with Narcan.  We will check labs, chest x-ray and monitor in the emergency department.  Work-up overall unrevealing, patient is chronically elevated liver enzymes.  Chest x-ray unremarkable.  Monitored in the emergency department with normal oxygen saturations  Went into the room to discuss discharge the patient and he had left/eloped    ____________________________________________   FINAL CLINICAL IMPRESSION(S) / ED  DIAGNOSES  Final diagnoses:  Opiate overdose, accidental or unintentional, initial encounter [facility])        Note:  This document was prepared using Dragon voice recognition software and may include unintentional dictation errors.    Jene Every, MD 08/16/17 1249

## 2017-08-25 DIAGNOSIS — S80862A Insect bite (nonvenomous), left lower leg, initial encounter: Secondary | ICD-10-CM | POA: Diagnosis not present

## 2017-08-25 DIAGNOSIS — L02416 Cutaneous abscess of left lower limb: Secondary | ICD-10-CM | POA: Diagnosis not present

## 2017-09-04 ENCOUNTER — Other Ambulatory Visit: Payer: Self-pay | Admitting: Family Medicine

## 2017-09-04 NOTE — Telephone Encounter (Signed)
[facility] stated he was taking Nexium, not Dexilant. In External Pharmacy Rachel sent in Nexium 40 mg 07/04/2017 #90 w/ 1 refill.  Patient states he's taking the Dexilant and Nexium, not the Zantac.  The Zantac doesn't help like the Nexium and Fleet ContrasRachel was aware of this plan.  Routing to appropriate provider due to Rachel's absence to make sure that is an appropriate combination.     Dexilant 60mg  Caps and Nexium 40mg  Caps.  Patient not taking Zantac 150mg .

## 2017-09-04 NOTE — Telephone Encounter (Signed)
No, not an appropriate combination.  Should take Nexium or Dexilant

## 2017-09-04 NOTE — Telephone Encounter (Signed)
[facility] called and  Spoke with patient regarding the zantac  rx  And  She will the disposition

## 2017-09-04 NOTE — Telephone Encounter (Signed)
Copied from CRM [id number]. Topic: Quick Communication - Rx Refill/Question >> Sep 04, 2017  1:28 PM Floria RavelingStovall, Shana A wrote: Medication: ranitidine (ZANTAC) 150 MG tablet [[id number]][[id number]]    Has the patient contacted their pharmacy? No  (Agent: If no, request that the patient contact the pharmacy for the refill.) (Agent: If yes, when and what did the pharmacy advise?)  Preferred Pharmacy (with phone number or street name): [id number] [place], [place] - [address] [phone] (Phone)   Agent: Please be advised that RX refills may take up to 3 business days. We ask that you follow-up with your pharmacy.

## 2017-09-07 MED ORDER — ESOMEPRAZOLE MAGNESIUM 40 MG PO CPDR: 40.0000 mg | DELAYED_RELEASE_CAPSULE | Freq: Every day | ORAL | 1 refills | Status: DC

## 2017-09-07 NOTE — Telephone Encounter (Signed)
esomeprazole refill Last Refill:not on med list Last OV: 09/04/17 PCP: Roosvelt Maserachel Lane PA [facility] [address]

## 2017-09-07 NOTE — Telephone Encounter (Signed)
Spoke with patient that he should be taking nexium and dexilant together.  Patient stated he would rather have the nexium 40mg  capsules instead of dexilant.  Patient is not taking zantac either. Dexilant and Zantac is being discontinued.  Please send Nexium 40mg  caps to pharmacy.

## 2017-09-08 ENCOUNTER — Encounter: Payer: Medicare Other | Admitting: Family Medicine

## 2017-09-14 ENCOUNTER — Telehealth: Payer: Self-pay | Admitting: Family Medicine

## 2017-09-14 NOTE — Telephone Encounter (Signed)
Looks like you refilled this.

## 2017-09-14 NOTE — Telephone Encounter (Signed)
Copied from CRM [id number]. Topic: Quick Communication - See Telephone Encounter >> Sep 14, 2017  1:23 PM Arlyss Gandyichardson, Melinda Gwinner N, NT wrote: CRM for notification. See Telephone encounter for: 09/14/17. Pt would like to see if more than 1 refill can be added to his esomeprazole (NEXIUM) 40 MG capsule rx?

## 2017-09-15 MED ORDER — ESOMEPRAZOLE MAGNESIUM 40 MG PO CPDR: 40.0000 mg | DELAYED_RELEASE_CAPSULE | Freq: Every day | ORAL | 2 refills | Status: DC

## 2017-10-19 ENCOUNTER — Telehealth: Payer: Self-pay | Admitting: Family Medicine

## 2017-10-19 MED ORDER — RANITIDINE HCL 150 MG PO TABS: 150.0000 mg | ORAL_TABLET | Freq: Two times a day (BID) | ORAL | 6 refills | Status: DC

## 2017-10-19 MED ORDER — ESOMEPRAZOLE MAGNESIUM 40 MG PO CPDR: 40.0000 mg | DELAYED_RELEASE_CAPSULE | Freq: Every day | ORAL | 6 refills | Status: DC

## 2017-10-19 NOTE — Telephone Encounter (Signed)
Refills sent  Copied from CRM [id number]. Topic: Inquiry >> Oct 19, 2017 10:44 AM Yvonna Alanisobinson, Andra M wrote: Reason for CRM: Patient called requesting refills of both Ranitidine 150mg  and Esomeprazole (NEXIUM) 40 MG capsule. [facility]'s preferred pharmacy [id number] [place], [place] - [address] [phone] (Phone) [phone] (Fax).       Thank You!!!

## 2017-10-22 DIAGNOSIS — R4020 Unspecified coma: Secondary | ICD-10-CM | POA: Diagnosis not present

## 2017-10-22 DIAGNOSIS — R000 Tachycardia, unspecified: Secondary | ICD-10-CM | POA: Diagnosis not present

## 2017-10-22 DIAGNOSIS — I959 Hypotension, unspecified: Secondary | ICD-10-CM | POA: Diagnosis not present

## 2017-10-22 DIAGNOSIS — R0902 Hypoxemia: Secondary | ICD-10-CM | POA: Diagnosis not present

## 2017-10-22 DIAGNOSIS — W19XXXA Unspecified fall, initial encounter: Secondary | ICD-10-CM | POA: Diagnosis not present

## 2017-10-23 ENCOUNTER — Emergency Department (HOSPITAL_COMMUNITY)
Admission: EM | Admit: 2017-10-23 | Discharge: 2017-10-23 | Disposition: A | Discharge status: Home / Self Care | Payer: Medicare Other | Attending: Emergency Medicine | Admitting: Emergency Medicine

## 2017-10-23 ENCOUNTER — Encounter (HOSPITAL_COMMUNITY): Payer: Self-pay | Admitting: Emergency Medicine

## 2017-10-23 ENCOUNTER — Emergency Department (HOSPITAL_COMMUNITY): Payer: Medicare Other

## 2017-10-23 ENCOUNTER — Other Ambulatory Visit: Payer: Self-pay

## 2017-10-23 DIAGNOSIS — J45909 Unspecified asthma, uncomplicated: Secondary | ICD-10-CM | POA: Diagnosis not present

## 2017-10-23 DIAGNOSIS — X58XXXA Exposure to other specified factors, initial encounter: Secondary | ICD-10-CM | POA: Insufficient documentation

## 2017-10-23 DIAGNOSIS — E039 Hypothyroidism, unspecified: Secondary | ICD-10-CM | POA: Insufficient documentation

## 2017-10-23 DIAGNOSIS — S0993XA Unspecified injury of face, initial encounter: Secondary | ICD-10-CM | POA: Diagnosis not present

## 2017-10-23 DIAGNOSIS — S0081XA Abrasion of other part of head, initial encounter: Secondary | ICD-10-CM | POA: Insufficient documentation

## 2017-10-23 DIAGNOSIS — Y999 Unspecified external cause status: Secondary | ICD-10-CM | POA: Diagnosis not present

## 2017-10-23 DIAGNOSIS — T148XXA Other injury of unspecified body region, initial encounter: Secondary | ICD-10-CM

## 2017-10-23 DIAGNOSIS — Y939 Activity, unspecified: Secondary | ICD-10-CM | POA: Diagnosis not present

## 2017-10-23 DIAGNOSIS — F10929 Alcohol use, unspecified with intoxication, unspecified: Secondary | ICD-10-CM | POA: Insufficient documentation

## 2017-10-23 DIAGNOSIS — S199XXA Unspecified injury of neck, initial encounter: Secondary | ICD-10-CM | POA: Diagnosis not present

## 2017-10-23 DIAGNOSIS — F10920 Alcohol use, unspecified with intoxication, uncomplicated: Secondary | ICD-10-CM

## 2017-10-23 DIAGNOSIS — Y929 Unspecified place or not applicable: Secondary | ICD-10-CM | POA: Insufficient documentation

## 2017-10-23 DIAGNOSIS — S0990XA Unspecified injury of head, initial encounter: Secondary | ICD-10-CM | POA: Diagnosis not present

## 2017-10-23 DIAGNOSIS — I10 Essential (primary) hypertension: Secondary | ICD-10-CM | POA: Insufficient documentation

## 2017-10-23 DIAGNOSIS — Z79899 Other long term (current) drug therapy: Secondary | ICD-10-CM | POA: Diagnosis not present

## 2017-10-23 DIAGNOSIS — S022XXA Fracture of nasal bones, initial encounter for closed fracture: Secondary | ICD-10-CM | POA: Diagnosis not present

## 2017-10-23 MED ORDER — NALOXONE HCL 4 MG/0.1ML NA LIQD: 0.4000 mg | Freq: Once | NASAL | Status: DC

## 2017-10-23 MED ORDER — BACITRACIN ZINC 500 UNIT/GM EX OINT
TOPICAL_OINTMENT | CUTANEOUS | Status: AC
  Administered 2017-10-23: 1
  Filled 2017-10-23: qty 0.9

## 2017-10-23 MED ORDER — NALOXONE HCL 4 MG/0.1ML NA LIQD
NASAL | Status: AC
  Filled 2017-10-23: qty 4

## 2017-10-23 NOTE — ED Triage Notes (Signed)
Pt is brought in by EMS from a "drag strip" with c/o alcohol intoxication.   Pt was found by a bystander passed out on the ground from heavy alcohol intoxication.  Pt admitted to drinking :a case of beer".  Pt arrived to ED alert and awake, but appears heavily etoh intoxicated.

## 2017-10-23 NOTE — ED Provider Notes (Signed)
[facility]-EMERGENCY DEPT Provider Note   CSN: [id number] Arrival date & time: 10/23/17  0041     History   Chief Complaint Chief Complaint  Patient presents with  . Alcohol Intoxication    HPI Melvin Brown is a 42 y.o. male.  The history is provided by the EMS personnel and the patient. The history is limited by the condition of the patient.  Alcohol Intoxication  This is a new problem. The current episode started 3 to 5 hours ago. The problem occurs constantly. The problem has not changed since onset.Pertinent negatives include no chest pain, no abdominal pain, no headaches and no shortness of breath. Nothing aggravates the symptoms. Nothing relieves the symptoms. He has tried nothing for the symptoms. The treatment provided no relief.  Was reportedly drinking and abraded his left cheek.    Past Medical History:  Diagnosis Date  . Allergic rhinitis   . Arthritis   . Asthma   . Carpal tunnel syndrome   . Cervicalgia   . Chronic pain   . Frequent headaches   . GERD (gastroesophageal reflux disease)   . Head injury   . History of cocaine abuse   . Hypothyroidism   . Low back pain   . Migraines   . OA (osteoarthritis)    right hand  . Psychoactive substance abuse (HCC)   . Spondylosis of cervical region without myelopathy or radiculopathy   . Spondylosis of lumbosacral region   . Torn rotator cuff 2002   right  . Traumatic amputation of left arm (HCC) (10/26/1998) 02/06/2015    Patient Active Problem List   Diagnosis Date Noted  . Cocaine use 02/20/2015  . Chronic pain of right upper extremity 02/20/2015  . Chronic neck pain 02/20/2015  . Chronic pain of right hand (Mild right thumb metacarpophalangeal joint osteoarthrosis.) 02/20/2015  . Chronic pain of right elbow (Chronic fractures and fusion radial and ulnar shaft fractures; evidence of hardware loosening and fracture nonunion of the ulnar fracture.) 02/20/2015  . Cervical spondylosis  (degenerative change involving the uncovertebral joint on the right at C5-6.) 02/20/2015  . Substance use disorder: High (Abnormal UDS: 05/27/2013 with Cocaine; 02/19/2015 with unexpected fentanyl and buprenorphine metabolites; 05/07/15 with unreported Hydrocodone & no Oxycodone metabolites) 02/07/2015  . Bronchial asthma 02/07/2015  . Essential hypertension 02/07/2015  . Chronic low back pain 02/07/2015  . Lumbar spondylosis (Old distracted right L5 transverse process fracture.) 02/07/2015  . Class I Morbid obesity (HCC) (68% higher risk of back problems) 02/07/2015  . Chronic pain 02/06/2015  . Long term current use of opiate analgesic 02/06/2015  . Long term prescription opiate use 02/06/2015  . Opiate use 02/06/2015  . Right arm pain 02/06/2015  . Traumatic amputation of left arm (HCC) (DOI: 10/26/1998) 02/06/2015  . Phantom limb syndrome with pain (HCC) (left upper extremity) 02/06/2015  . Muscle spasticity 02/06/2015  . 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 Chronic right shoulder pain 02/06/2015  . Carpal tunnel syndrome (right side) 02/06/2015  . Encounter for therapeutic drug level monitoring 02/06/2015  . Polysubstance abuse (HCC)   . Other shoulder lesions, right shoulder 11/10/2014  . Hypothyroidism 10/13/2014  . Elevated liver enzymes 10/13/2014  . Peripheral edema 10/12/2014  . Chronic pain associated with significant psychosocial dysfunction 10/12/2014  . Disorder of rotator cuff 10/12/2014  . Chronic pain syndrome 09/08/2013  . Environmental allergies 09/08/2013  .  GERD (gastroesophageal reflux disease) 09/08/2013  . Abnormal toxicological findings 08/07/2013  . Overweight 05/11/2013  . Asthma, mild intermittent 02/11/2013  . Allergic rhinitis, seasonal 08/12/2012    Past Surgical History:  Procedure Laterality Date  . ARM AMPUTATION Left   . ARM RECONSTRUCTION  Right    X 5  . PELVIC FRACTURE SURGERY    . WISDOM TOOTH EXTRACTION          Home Medications    Prior to Admission medications   Medication Sig Start Date End Date Taking? Authorizing Provider  albuterol (PROVENTIL HFA;VENTOLIN HFA) 108 (90 Base) MCG/ACT inhaler Inhale 2 puffs into the lungs every 6 (six) hours as needed for wheezing or shortness of breath. 03/10/17   Particia Nearing, PA-C  calcitonin, salmon, (MIACALCIN/FORTICAL) 200 UNIT/ACT nasal spray Place into the nose. 07/31/15   [provider]  esomeprazole (NEXIUM) 40 MG capsule Take 1 capsule (40 mg total) by mouth daily at 12 noon. 10/19/17   Particia Nearing, PA-C  fluticasone [facility]) 50 MCG/ACT nasal spray Place 2 sprays into both nostrils daily. 03/10/17   Particia Nearing, PA-C  Fluticasone-Salmeterol (ADVAIR DISKUS) 250-50 MCG/DOSE AEPB Inhale 1 puff into the lungs 2 (two) times daily. 03/10/17   Particia Nearing, PA-C  gabapentin (NEURONTIN) 800 MG tablet Take 800 mg by mouth 3 (three) times daily. 08/04/16   [provider]  ibuprofen (ADVIL,MOTRIN) 800 MG tablet 800 mg as needed.  01/28/12   [provider]  levothyroxine (SYNTHROID, LEVOTHROID) 25 MCG tablet Take 25 mcg by mouth daily. 10/13/14   [provider]  Loratadine 10 MG CAPS Take 1 capsule (10 mg total) by mouth daily as needed. 03/10/17   Particia Nearing, PA-C  meloxicam (MOBIC) 15 MG tablet Take 1 tablet (15 mg total) by mouth daily. 05/07/15   Delano Metz, MD  ranitidine (ZANTAC) 150 MG tablet Take 1 tablet (150 mg total) by mouth 2 (two) times daily. 10/19/17   Particia Nearing, PA-C    Family History Family History  Problem Relation Age of Onset  . Arthritis Maternal Grandfather   . Cancer Maternal Grandfather        Colon, prostate  . Hypertension Father   . CAD Maternal Grandmother   . Arthritis Paternal Grandmother   . Hypertension Paternal Grandmother   . Stroke  Paternal Grandmother   . Heart attack Paternal Grandfather   . CAD Paternal Grandfather   . SIDS Daughter   . Diabetes Neg Hx   . Hearing loss Neg Hx     Social History Social History   Tobacco Use  . Smoking status: Never Smoker  . Smokeless tobacco: Never Used  Substance Use Topics  . Alcohol use: Yes  . Drug use: Yes     Allergies   Patient has no known allergies.   Review of Systems Review of Systems  Unable to perform ROS: Other  Eyes: Negative for visual disturbance.  Respiratory: Negative for shortness of breath.   Cardiovascular: Negative for chest pain, palpitations and leg swelling.  Gastrointestinal: Negative for abdominal pain.  Genitourinary: Negative for flank pain.  Musculoskeletal: Negative for neck pain.  Neurological: Negative for weakness, numbness and headaches.  All other systems reviewed and are negative.    Physical Exam Updated Vital Signs BP 123/84 (BP Location: Right Arm)   Pulse 93   Temp 98.2 F (36.8 C) (Oral)   Resp 18   SpO2 95%   Physical Exam  Constitutional: He  appears well-developed and well-nourished. No distress.  HENT:  Head: Normocephalic.    Right Ear: No mastoid tenderness. No hemotympanum.  Left Ear: No mastoid tenderness. No hemotympanum.  Nose: No sinus tenderness, nasal deformity or nasal septal hematoma. No epistaxis.  Eyes: Pupils are equal, round, and reactive to light. Conjunctivae are normal.  Neck: No tracheal deviation present.  Cardiovascular: Normal rate, regular rhythm, normal heart sounds and intact distal pulses.  Pulmonary/Chest: Effort normal and breath sounds normal. No stridor. He has no wheezes. He has no rales.  Abdominal: Soft. Bowel sounds are normal. He exhibits no mass. There is no tenderness. There is no guarding.  Musculoskeletal: Normal range of motion.  Neurological: He is alert. He displays normal reflexes.  Skin: Skin is warm. Capillary refill takes less than 2 seconds.    Psychiatric: He has a normal mood and affect.     ED Treatments / Results  Labs (all labs ordered are listed, but only abnormal results are displayed) Labs Reviewed - No data to display  EKG None  Radiology Ct Head Wo Contrast  Result Date: 10/23/2017 CLINICAL DATA:  42 year old male with fall. EXAM: CT HEAD WITHOUT CONTRAST CT MAXILLOFACIAL WITHOUT CONTRAST CT CERVICAL SPINE WITHOUT CONTRAST TECHNIQUE: Multidetector CT imaging of the head, cervical spine, and maxillofacial structures were performed using the standard protocol without intravenous contrast. Multiplanar CT image reconstructions of the cervical spine and maxillofacial structures were also generated. COMPARISON:  None. FINDINGS: CT HEAD FINDINGS Brain: There is an area of old infarct and encephalomalacia in the right frontal lobe. There is no acute intracranial hemorrhage. No mass effect or midline shift. No extra-axial fluid collection. Vascular: No hyperdense vessel or unexpected calcification. Skull: Normal. Negative for fracture or focal lesion. Other: None CT MAXILLOFACIAL FINDINGS Osseous: There is slight step-off of the left nasal bone without overlying soft tissue swelling. This likely represents an old fracture and less likely an acute fracture. Correlation with clinical exam and point tenderness recommended. No definite acute fracture. Orbits: Negative. No traumatic or inflammatory finding. Sinuses: There is mild mucoperiosteal thickening of paranasal sinuses. No air-fluid levels. The mastoid air cells are clear. Soft tissues: Negative. CT CERVICAL SPINE FINDINGS Alignment: No acute subluxation. Skull base and vertebrae: No acute fracture. Soft tissues and spinal canal: No prevertebral fluid or swelling. No visible canal hematoma. Disc levels: Mild multilevel degenerative changes with disc space narrowing and endplate irregularity. Upper chest: Negative. Other: None IMPRESSION: 1. No acute intracranial hemorrhage. 2. Focal  area of old infarct and encephalomalacia in the right frontal lobe. 3. Minimally depressed fracture of the left nasal bone, likely chronic. Clinical correlation is recommended. No other acute facial bone fracture identified. 4. No acute/traumatic cervical spine pathology. Electronically Signed   By: Elgie Collard M.D.   On: 10/23/2017 02:47   Ct Cervical Spine Wo Contrast  Result Date: 10/23/2017 CLINICAL DATA:  42 year old male with fall. EXAM: CT HEAD WITHOUT CONTRAST CT MAXILLOFACIAL WITHOUT CONTRAST CT CERVICAL SPINE WITHOUT CONTRAST TECHNIQUE: Multidetector CT imaging of the head, cervical spine, and maxillofacial structures were performed using the standard protocol without intravenous contrast. Multiplanar CT image reconstructions of the cervical spine and maxillofacial structures were also generated. COMPARISON:  None. FINDINGS: CT HEAD FINDINGS Brain: There is an area of old infarct and encephalomalacia in the right frontal lobe. There is no acute intracranial hemorrhage. No mass effect or midline shift. No extra-axial fluid collection. Vascular: No hyperdense vessel or unexpected calcification. Skull: Normal. Negative for fracture or focal lesion.  Other: None CT MAXILLOFACIAL FINDINGS Osseous: There is slight step-off of the left nasal bone without overlying soft tissue swelling. This likely represents an old fracture and less likely an acute fracture. Correlation with clinical exam and point tenderness recommended. No definite acute fracture. Orbits: Negative. No traumatic or inflammatory finding. Sinuses: There is mild mucoperiosteal thickening of paranasal sinuses. No air-fluid levels. The mastoid air cells are clear. Soft tissues: Negative. CT CERVICAL SPINE FINDINGS Alignment: No acute subluxation. Skull base and vertebrae: No acute fracture. Soft tissues and spinal canal: No prevertebral fluid or swelling. No visible canal hematoma. Disc levels: Mild multilevel degenerative changes with disc  space narrowing and endplate irregularity. Upper chest: Negative. Other: None IMPRESSION: 1. No acute intracranial hemorrhage. 2. Focal area of old infarct and encephalomalacia in the right frontal lobe. 3. Minimally depressed fracture of the left nasal bone, likely chronic. Clinical correlation is recommended. No other acute facial bone fracture identified. 4. No acute/traumatic cervical spine pathology. Electronically Signed   By: Elgie CollardArash  Radparvar M.D.   On: 10/23/2017 02:47   Ct Maxillofacial Wo Contrast  Result Date: 10/23/2017 CLINICAL DATA:  [age] year old male with fall. EXAM: CT HEAD WITHOUT CONTRAST CT MAXILLOFACIAL WITHOUT CONTRAST CT CERVICAL SPINE WITHOUT CONTRAST TECHNIQUE: Multidetector CT imaging of the head, cervical spine, and maxillofacial structures were performed using the standard protocol without intravenous contrast. Multiplanar CT image reconstructions of the cervical spine and maxillofacial structures were also generated. COMPARISON:  None. FINDINGS: CT HEAD FINDINGS Brain: There is an area of old infarct and encephalomalacia in the right frontal lobe. There is no acute intracranial hemorrhage. No mass effect or midline shift. No extra-axial fluid collection. Vascular: No hyperdense vessel or unexpected calcification. Skull: Normal. Negative for fracture or focal lesion. Other: None CT MAXILLOFACIAL FINDINGS Osseous: There is slight step-off of the left nasal bone without overlying soft tissue swelling. This likely represents an old fracture and less likely an acute fracture. Correlation with clinical exam and point tenderness recommended. No definite acute fracture. Orbits: Negative. No traumatic or inflammatory finding. Sinuses: There is mild mucoperiosteal thickening of paranasal sinuses. No air-fluid levels. The mastoid air cells are clear. Soft tissues: Negative. CT CERVICAL SPINE FINDINGS Alignment: No acute subluxation. Skull base and vertebrae: No acute fracture. Soft tissues and  spinal canal: No prevertebral fluid or swelling. No visible canal hematoma. Disc levels: Mild multilevel degenerative changes with disc space narrowing and endplate irregularity. Upper chest: Negative. Other: None IMPRESSION: 1. No acute intracranial hemorrhage. 2. Focal area of old infarct and encephalomalacia in the right frontal lobe. 3. Minimally depressed fracture of the left nasal bone, likely chronic. Clinical correlation is recommended. No other acute facial bone fracture identified. 4. No acute/traumatic cervical spine pathology. Electronically Signed   By: Elgie CollardArash  Radparvar M.D.   On: 10/23/2017 02:47    Procedures Procedures (including critical care time)  Medications Ordered in ED Medications  bacitracin 500 UNIT/GM ointment (1 application  Given 10/23/17 0059)       Final Clinical Impressions(s) / ED Diagnoses   Return for pain, numbness, changes in vision or speech, fevers >100.4 unrelieved by medication, shortness of breath, intractable vomiting, or diarrhea, abdominal pain, Inability to tolerate liquids or food, cough, altered mental status or any concerns. No signs of systemic illness or infection. The patient is nontoxic-appearing on exam and vital signs are within normal limits. Will refer to urology for microscopy hematuria as patient is asymptomatic.  I have reviewed the triage vital signs and the nursing notes.  Pertinent labs &imaging results that were available during my care of the patient were reviewed by me and considered in my medical decision making (see chart for details).  After history, exam, and medical workup I feel the patient has been appropriately medically screened and is safe for discharge home. Pertinent diagnoses were discussed with the patient. Patient was given return precautions.   Jaeli Grubb, MD 10/23/17 0300

## 2017-10-23 NOTE — ED Notes (Signed)
Bed: [id number] Expected date:  Expected time:  Means of arrival:  Comments: EMS- alcohol intoxication

## 2017-12-09 ENCOUNTER — Encounter: Payer: Medicare Other | Admitting: Family Medicine

## 2017-12-09 ENCOUNTER — Ambulatory Visit: Payer: Medicare Other

## 2017-12-21 ENCOUNTER — Ambulatory Visit: Payer: Self-pay

## 2018-01-13 ENCOUNTER — Other Ambulatory Visit: Payer: Self-pay | Admitting: *Deleted

## 2018-01-13 NOTE — Patient Outreach (Signed)
[facility]) Care Management  01/13/2018  Melvin Brown 1976/02/16 [id number]   Care coordination   [facility] RN CM received a return call from Merry Lofty from [facility] Samara Deist clarified her review referral reason to include concerns with increase ED visits and counselor services. [name] has received a letter from the [facility] regarding no shows and cancellations Cm reviewed the contact call with [name] and his primary response as "no"  Samara Deist to discuss with Dr Maurice [facility] RN CM encouraged contact with patient to encourage engagement with [facility] services  Plan [facility] RN CM to pend case for 7 business days and consult with [facility] staff again to see if pt is willing to engage for services     Vanisha Whiten L. Noelle Penner, RN, BSN, CCM [facility] Telephonic Care Management Care Coordinator Office number [phone] Mobile number [phone]  Main [facility] number [phone] Fax number [phone]

## 2018-01-13 NOTE — Patient Outreach (Addendum)
[facility]) Care Management  01/13/2018  Melvin Brown 22-Apr-1975 [id number]   Telephone Screen  Referral Date: 01/12/18 Referral Source: MD referral -[facility]  Referral Reason: 01/12/2018 1:56 PM EDT General Multiple visits to ER opioid overdose , counseling, could use help from Editor, commissioning, [place] Insurance: [employer], medicaid of [place]   Outreach attempt # 1, successful to his listed home number  Patient is able to verify HIPAA Reviewed and addressed referral to [facility] with patient -Discussed referral for [facility] SW assistance  Melvin Brown informs [facility] RN CM that he does not understand why CM is calling him after the referral is discussed multiple times.He reports the CM call "came up fraud." and he is "awaiting a call back about my fraud case." "because he is not able to get my usual medicine from my doctor."  "that's okay I am not going to explain it again." when CM informed him she was not understanding what he was discussing   He informs Cm he does not know a Melvin Brown when he inquires who the referral is from He confirms he attends [name] and is seen by Melvin Brown He agrees to allow the [facility] RN CM to complete the intake assessment and screening. Most of his answers are "no"  He denies during the assessment that he has been to the ED or hospital recently "No". He denies counseling concerns.  He denies any need for any [facility] SW services after CM in detailed reviewed services [facility] SW can assist with.   Social: He reports independent with all ADLs and iADLs He reports he drives to his medical appointments or is assisted by his family.   Conditions: Asthma confirmed by pt,  Listed in Epic but not confirmed by pt -HTN,  Other conditions for pt includes GERD, allergic rhinitis, hypothyroidism , opiate overdose, cocaine abuse, low back pain , chronic pain syndrome, pain of right hand, elbow, arm, shoulder   Medications: He  denies concerns with taking medications as prescribed, affording medications, side effects of medications and questions about medications He reports he is unable to get medications related to "fraud" He reports having the flu shot last year DME he reports not having any DME   Appointments: Saw Melvin Brown 2-3 months ago   Advance Directives: He reports having a POA but not a living will and denies interest in getting assistance to get a living will completed   Consent: [facility] RN CM reviewed [facility] services with patient. Patient gave verbal consent for services. He denies need of services from [facility] Community/Telephonic RN CM, pharmacy, health coach, NP or SW at this time   Plan: [facility] RN CM will consult with [facility] staff further about this referral and the patient's response to contact and assessment.  [facility] RN CM sent a successful outreach letter as discussed with [facility] brochure enclosed for review  Pt encouraged to return a call to [facility] RN CM prn and to return a call to his MD office if he had concerns   [facility] RN CM called [facility] office. Spoke with Melvin Brown who states Melvin Brown is not available but provided a number to reach [facility] RN CM left a message for Melvin Brown, requested a return call   Routed note to Melvin [place] L. Noelle Penner, RN, BSN, CCM [facility] Telephonic Care Management Care Coordinator Office number [phone] Mobile number [phone]  Main [facility] number  [phone] Fax number [phone]

## 2018-01-14 ENCOUNTER — Other Ambulatory Visit: Payer: Self-pay | Admitting: *Deleted

## 2018-01-14 NOTE — Patient Outreach (Signed)
[facility]) Care Management  01/14/2018  Melvin Brown 11-Apr-1975 [id number]   Case closure  [facility] RN CM spoke with Mr Melvin Brown on 01/13/18 after receiving a referral from his MD office for possible [facility] SW referral needs.  Mr Melvin Brown denied having identifiable needs and need of services from [facility]/Telephonic RN CM, pharmacy, health coach, NP or SW at this time.  [facility] RN CM consulted Melvin Brown and routed the [facility] assessment note to Dr Melvin Brown.  At this time Dr Melvin Brown and Melvin Brown appreciates the assessment completion but agrees to case closure if Mr Melvin Brown is refusing [facility] Services and to participate  [facility] RN CM will close case at this time as patient has been assessed and denies needs A successful outreach letter was sent and [facility] is willing to assist Mr Melvin Brown in the future prn    Melvin Brown L. Melvin Penner, RN, BSN, CCM [facility] Telephonic Care Management Care Coordinator Office number [phone] Mobile number [phone]  Main [facility] number [phone] Fax number [phone]

## 2018-01-20 ENCOUNTER — Ambulatory Visit: Payer: Self-pay | Admitting: *Deleted

## 2018-04-08 ENCOUNTER — Other Ambulatory Visit: Payer: Self-pay | Admitting: Family Medicine

## 2018-04-09 NOTE — Telephone Encounter (Signed)
Attempted to notify patient regarding his refill for pro air. No answer and unable to leave voice mail.  Pt is overdue for an office visit. LOV 03/10/17 and last refill 03/10/17.

## 2018-04-11 ENCOUNTER — Other Ambulatory Visit: Payer: Self-pay | Admitting: Family Medicine

## 2018-04-12 ENCOUNTER — Ambulatory Visit: Payer: Self-pay | Admitting: Family Medicine

## 2018-04-12 NOTE — Telephone Encounter (Signed)
Requested medication (s) are due for refill today: Yes  Requested medication (s) are on the active medication list: Wilexa - no  Last refill:  03/10/17  Future visit scheduled: No  Notes to clinic:  See request    Requested Prescriptions  Pending Prescriptions Disp Refills   PROAIR HFA 108 (90 Base) MCG/ACT inhaler [Pharmacy Med Name: PROAIR HFA 90 MCG INHALER] 25.5 Inhaler 2    Sig: TAKE 2 PUFFS BY MOUTH EVERY 6 HOURS AS NEEDED FOR WHEEZING OR SHORTNESS OF BREATH     Pulmonology:  Beta Agonists Failed - 04/11/2018  2:24 PM      Failed - One inhaler should last at least one month. If the patient is requesting refills earlier, contact the patient to check for uncontrolled symptoms.      Failed - Valid encounter within last 12 months    Recent Outpatient Visits          1 year ago Seasonal allergic rhinitis, unspecified trigger   [facility] [place] Maser [place], [place]   1 year ago Mild intermittent asthma without complication   [facility] Particia [place], [place]   3 years ago Shoulder pain, acute, right   [facility], Megan P, DO            WIXELA INHUB 250-50 MCG/DOSE Kary Kos Med Name: Monte Fantasia 250-50 INHUB] 180 each 3    Sig: TAKE 1 PUFF BY MOUTH TWICE A DAY     Pulmonology:  Combination Products Failed - 04/11/2018  2:24 PM      Failed - Valid encounter within last 12 months    Recent Outpatient Visits          1 year ago Seasonal allergic rhinitis, unspecified trigger   [facility] Particia N[place], [place]   1 year ago Mild intermittent asthma without complication   [facility] Particia N[place], [place]   3 years ago Shoulder pain, acute, right   [facility], [place], [place]

## 2018-04-12 NOTE — Telephone Encounter (Signed)
Pt. Refuses to schedule an appointment. States he will call back .Reports he needs his [employer] refilled as well. Please advise pt.

## 2018-04-13 ENCOUNTER — Other Ambulatory Visit: Payer: Self-pay | Admitting: Family Medicine

## 2018-04-16 ENCOUNTER — Other Ambulatory Visit: Payer: Self-pay

## 2018-04-16 ENCOUNTER — Encounter: Payer: Self-pay | Admitting: Family Medicine

## 2018-04-16 NOTE — Telephone Encounter (Signed)
Called and spoke with patient. Let him know what Fleet Contras said that patient needs an appt to refill Rx. Pt stated that he will call to the office when he is ready to schedule an OV.

## 2018-05-05 ENCOUNTER — Encounter: Payer: Self-pay | Admitting: Family Medicine

## 2018-05-05 ENCOUNTER — Ambulatory Visit (INDEPENDENT_AMBULATORY_CARE_PROVIDER_SITE_OTHER): Payer: Medicare Other | Admitting: Family Medicine

## 2018-05-05 VITALS — BP 150/84 | HR 69 | Temp 97.7°F | Ht 68.75 in | Wt 230.0 lb

## 2018-05-05 DIAGNOSIS — K219 Gastro-esophageal reflux disease without esophagitis: Secondary | ICD-10-CM

## 2018-05-05 DIAGNOSIS — E6609 Other obesity due to excess calories: Secondary | ICD-10-CM

## 2018-05-05 DIAGNOSIS — E039 Hypothyroidism, unspecified: Secondary | ICD-10-CM

## 2018-05-05 DIAGNOSIS — I10 Essential (primary) hypertension: Secondary | ICD-10-CM

## 2018-05-05 DIAGNOSIS — Z0000 Encounter for general adult medical examination without abnormal findings: Secondary | ICD-10-CM

## 2018-05-05 DIAGNOSIS — Z114 Encounter for screening for human immunodeficiency virus [HIV]: Secondary | ICD-10-CM

## 2018-05-05 DIAGNOSIS — S48912S Complete traumatic amputation of left shoulder and upper arm, level unspecified, sequela: Secondary | ICD-10-CM | POA: Diagnosis not present

## 2018-05-05 DIAGNOSIS — Z6834 Body mass index (BMI) 34.0-34.9, adult: Secondary | ICD-10-CM

## 2018-05-05 DIAGNOSIS — J4540 Moderate persistent asthma, uncomplicated: Secondary | ICD-10-CM

## 2018-05-05 DIAGNOSIS — J3089 Other allergic rhinitis: Secondary | ICD-10-CM

## 2018-05-05 LAB — UA/M W/RFLX CULTURE, ROUTINE
BILIRUBIN UA: NEGATIVE
Glucose, UA: NEGATIVE
Ketones, UA: NEGATIVE
Leukocytes, UA: NEGATIVE
Nitrite, UA: NEGATIVE
Protein, UA: NEGATIVE
RBC, UA: NEGATIVE
Specific Gravity, UA: 1.015 (ref 1.005–1.030)
Urobilinogen, Ur: 0.2 mg/dL (ref 0.2–1.0)
pH, UA: 5 (ref 5.0–7.5)

## 2018-05-05 MED ORDER — FLUTICASONE PROPIONATE 50 MCG/ACT NA SUSP: 2.0000 | Freq: Every day | NASAL | 11 refills | Status: DC

## 2018-05-05 MED ORDER — RANITIDINE HCL 150 MG PO TABS: 150.0000 mg | ORAL_TABLET | Freq: Two times a day (BID) | ORAL | 6 refills | Status: AC

## 2018-05-05 MED ORDER — FLUTICASONE-SALMETEROL 250-50 MCG/DOSE IN AEPB: 1.0000 | INHALATION_SPRAY | Freq: Two times a day (BID) | RESPIRATORY_TRACT | 11 refills | Status: DC

## 2018-05-05 MED ORDER — ALBUTEROL SULFATE HFA 108 (90 BASE) MCG/ACT IN AERS: 2.0000 | INHALATION_SPRAY | Freq: Four times a day (QID) | RESPIRATORY_TRACT | 6 refills | Status: DC | PRN

## 2018-05-05 MED ORDER — PREDNISONE 20 MG PO TABS: 40.0000 mg | ORAL_TABLET | Freq: Every day | ORAL | 0 refills | Status: DC

## 2018-05-05 MED ORDER — LORATADINE 10 MG PO CAPS: 10.0000 mg | ORAL_CAPSULE | Freq: Every day | ORAL | 3 refills | Status: DC | PRN

## 2018-05-05 MED ORDER — ESOMEPRAZOLE MAGNESIUM 40 MG PO CPDR: 40.0000 mg | DELAYED_RELEASE_CAPSULE | Freq: Every day | ORAL | 6 refills | Status: DC

## 2018-05-05 MED ORDER — LEVOTHYROXINE SODIUM 25 MCG PO TABS: 25.0000 ug | ORAL_TABLET | Freq: Every day | ORAL | 1 refills | Status: DC

## 2018-05-05 NOTE — Assessment & Plan Note (Signed)
Restart allergy regimen

## 2018-05-05 NOTE — Assessment & Plan Note (Signed)
Just got a gym membership, will work on improving diet and exercise habits

## 2018-05-05 NOTE — Assessment & Plan Note (Signed)
Discussed importance of regular labs and consistent use of synthroid. Will obtain baseline TSH today, restart synthroid, and recheck in 6 weeks

## 2018-05-05 NOTE — Assessment & Plan Note (Signed)
Slightly high today, work on [employer] and exercise and recheck in 6 weeks

## 2018-05-05 NOTE — Assessment & Plan Note (Signed)
Stable

## 2018-05-05 NOTE — Assessment & Plan Note (Signed)
Will start prednisone burst given mild exacerbation from URI. Restart home inhaler regimen

## 2018-05-05 NOTE — Progress Notes (Signed)
BP (!) 150/84   Pulse 69   Temp 97.7 F (36.5 C) (Oral)   Ht 5' 8.75" (1.746 m)   Wt 230 lb (104.3 kg)   BMI 34.21 kg/m    Subjective:    Patient ID: Melvin Brown, male    DOB: 1975/05/15, 43 y.o.   MRN: [id number]  HPI: Melvin Brown is a 43 y.o. male presenting on 05/05/2018 for comprehensive medical examination. Current medical complaints include:see below  Has been out of his inhalers and allergy medicines for a while now. Sick x 2 weeks with rhinorrhea, wheezing, chest tightness. Denies fevers, chills, SOB, CP, body aches.   Has not been on synthroid in years, thought someone told him that that wasn't a long term condition and he could stop it. Last TSH 8.1 in 2016.   Takes nexium for GERD without issue.   He currently lives with: Interim Problems from his last visit: no  Depression Screen done today and results listed below:  Dep[facility] screen [facility] 01/13/2018 12/09/2016 05/07/2015 03/19/2015 02/19/2015  Decreased Interest 0 0 0 0 0  Down, Depressed, Hopeless 0 0 0 0 0  PHQ - 2 Score 0 0 0 0 0    The patient does not have a history of falls. I did not complete a risk assessment for falls. A plan of care for falls was not documented.   Past Medical History:  Past Medical History:  Diagnosis Date  . Allergic rhinitis   . Arthritis   . Asthma   . Carpal tunnel syndrome   . Cervicalgia   . Chronic pain   . Frequent headaches   . GERD (gastroesophageal reflux disease)   . Head injury   . History of cocaine abuse (HCC)   . Hypothyroidism   . Low back pain   . Migraines   . OA (osteoarthritis)    right hand  . Psychoactive substance abuse (HCC)   . Spondylosis of cervical region without myelopathy or radiculopathy   . Spondylosis of lumbosacral region   . Torn rotator cuff 2002   right  . Traumatic amputation of left arm (HCC) (10/26/1998) 02/06/2015    Surgical History:  Past Surgical History:  Procedure Laterality Date  . ARM AMPUTATION Left   . ARM  RECONSTRUCTION Right    X 5  . PELVIC FRACTURE SURGERY    . WISDOM TOOTH EXTRACTION      Medications:  Current Outpatient Medications on File Prior to Visit  Medication Sig  . calcitonin, salmon, (MIACALCIN/FORTICAL) 200 UNIT/ACT nasal spray Place into the nose.  . gabapentin (NEURONTIN) 800 MG tablet Take 800 mg by mouth 3 (three) times daily.  Marland Kitchen. ibuprofen (ADVIL,MOTRIN) 800 MG tablet 800 mg as needed.   . meloxicam (MOBIC) 15 MG tablet Take 1 tablet (15 mg total) by mouth daily.   No current facility-administered medications on file prior to visit.     Allergies:  No Known Allergies  Social History:  Social History   Socioeconomic History  . Marital status: Single    Spouse name: Not on file  . Number of children: Not on file  . Years of education: Not on file  . Highest education level: Not on file  Occupational History  . Not on file  Social Needs  . Financial resource strain: Not on file  . Food insecurity:    Worry: Not on file    Inability: Not on file  . Transportation needs:    Medical: Not  on file    Non-medical: Not on file  Tobacco Use  . Smoking status: Never Smoker  . Smokeless tobacco: Never Used  Substance and Sexual Activity  . Alcohol use: Yes  . Drug use: Yes  . Sexual activity: Yes  Lifestyle  . Physical activity:    Days per week: Not on file    Minutes per session: Not on file  . Stress: Not on file  Relationships  . Social connections:    Talks on phone: Not on file    Gets together: Not on file    Attends religious service: Not on file    Active member of club or organization: Not on file    Attends meetings of clubs or organizations: Not on file    Relationship status: Not on file  . Intimate partner violence:    Fear of current or ex partner: Not on file    Emotionally abused: Not on file    Physically abused: Not on file    Forced sexual activity: Not on file  Other Topics Concern  . Not on file  Social History Narrative  .  Not on file   Social History   Tobacco Use  Smoking Status Never Smoker  Smokeless Tobacco Never Used   Social History   Substance and Sexual Activity  Alcohol Use Yes    Family History:  Family History  Problem Relation Age of Onset  . Arthritis Maternal Grandfather   . Cancer Maternal Grandfather        Colon, prostate  . Hypertension Father   . CAD Maternal Grandmother   . Arthritis Paternal Grandmother   . Hypertension Paternal Grandmother   . Stroke Paternal Grandmother   . Heart attack Paternal Grandfather   . CAD Paternal Grandfather   . SIDS Daughter   . Diabetes Neg Hx   . Hearing loss Neg Hx     Past medical history, surgical history, medications, allergies, family history and social history reviewed with patient today and changes made to appropriate areas of the chart.   Review of Systems - General ROS: negative Psychological ROS: negative Ophthalmic ROS: negative ENT ROS: positive for - nasal discharge Allergy and Immunology ROS: negative Hematological and Lymphatic ROS: negative Endocrine ROS: negative Respiratory ROS: positive for - cough and wheezing Cardiovascular ROS: no chest pain or dyspnea on exertion Gastrointestinal ROS: no abdominal pain, change in bowel habits, or black or bloody stools Genito-Urinary ROS: no dysuria, trouble voiding, or hematuria Musculoskeletal ROS: negative Neurological ROS: no TIA or stroke symptoms Dermatological ROS: negative All other ROS negative except what is listed above and in the HPI.      Objective:    BP (!) 150/84   Pulse 69   Temp 97.7 F (36.5 C) (Oral)   Ht 5' 8.75" (1.746 m)   Wt 230 lb (104.3 kg)   BMI 34.21 kg/m   Wt Readings from Last 3 Encounters:  05/05/18 230 lb (104.3 kg)  08/16/17 195 lb (88.5 kg)  05/12/17 190 lb (86.2 kg)    Physical Exam Vitals signs and nursing note reviewed.  Constitutional:      General: He is not in acute distress.    Appearance: He is well-developed.    HENT:     Head: Atraumatic.     Right Ear: External ear normal.     Left Ear: External ear normal.     Nose: Rhinorrhea present.     Mouth/Throat:     Pharynx: Posterior  oropharyngeal erythema present.  Eyes:     General: No scleral icterus.    Conjunctiva/sclera: Conjunctivae normal.     Pupils: Pupils are equal, round, and reactive to light.  Neck:     Musculoskeletal: Normal range of motion and neck supple.  Cardiovascular:     Rate and Rhythm: Normal rate and regular rhythm.     Heart sounds: Normal heart sounds. No murmur.  Pulmonary:     Effort: Pulmonary effort is normal. No respiratory distress.     Breath sounds: Wheezing (minimal) present.  Abdominal:     General: Bowel sounds are normal. There is no distension.     Palpations: Abdomen is soft. There is no mass.     Tenderness: There is no abdominal tenderness. There is no guarding.  Genitourinary:    Comments: Exam declined Musculoskeletal: Normal range of motion.        General: No tenderness.     Comments: Left UE amputation Otherwise strength and ROM normal  Skin:    General: Skin is warm and dry.     Findings: No rash.  Neurological:     Mental Status: He is alert and oriented to person, place, and time.     Deep Tendon Reflexes: Reflexes are normal and symmetric.  Psychiatric:        Behavior: Behavior normal.     Results for orders placed or performed during the hospital encounter of 08/16/17  CBC  Result Value Ref Range   WBC 8.6 3.8 - 10.6 K/uL   RBC 4.62 4.40 - 5.90 MIL/uL   Hemoglobin 14.6 13.0 - 18.0 g/dL   HCT 50.5 69.7 - 94.8 %   MCV 91.5 80.0 - 100.0 fL   MCH 31.5 26.0 - 34.0 pg   MCHC 34.5 32.0 - 36.0 g/dL   RDW 01.6 (H) 55.3 - 74.8 %   Platelets 205 150 - 440 K/uL  Comprehensive metabolic panel  Result Value Ref Range   Sodium 131 (L) 135 - 145 mmol/L   Potassium 3.2 (L) 3.5 - 5.1 mmol/L   Chloride 95 (L) 101 - 111 mmol/L   CO2 24 22 - 32 mmol/L   Glucose, Bld 160 (H) 65 - 99  mg/dL   BUN 14 6 - 20 mg/dL   Creatinine, Ser 2.70 0.61 - 1.24 mg/dL   Calcium 9.0 8.9 - 78.6 mg/dL   Total Protein 9.8 (H) 6.5 - 8.1 g/dL   Albumin 4.3 3.5 - 5.0 g/dL   AST 754 (H) 15 - 41 U/L   ALT 263 (H) 17 - 63 U/L   Alkaline Phosphatase 110 38 - 126 U/L   Total Bilirubin 1.5 (H) 0.3 - 1.2 mg/dL   GFR calc non Af Amer >60 >60 mL/min   GFR calc Af Amer >60 >60 mL/min   Anion gap 12 5 - 15  Troponin I  Result Value Ref Range   Troponin I <0.03 <0.03 ng/mL      Assessment & Plan:   Problem List Items Addressed This Visit      Cardiovascular and Mediastinum   Essential hypertension    Slightly high today, work on DASH diet and exercise and recheck in 6 weeks        Respiratory   Asthma    Will start prednisone burst given mild exacerbation from URI. Restart home inhaler regimen      Relevant Medications   Fluticasone-Salmeterol (ADVAIR DISKUS) 250-50 MCG/DOSE AEPB   albuterol (PROVENTIL HFA;VENTOLIN HFA) 108 (90 Base)  MCG/ACT inhaler   predniSONE (DELTASONE) 20 MG tablet   Allergic rhinitis, seasonal - Primary    Restart allergy regimen        Digestive   GERD (gastroesophageal reflux disease)    Stable, continue current regimen      Relevant Medications   esomeprazole (NEXIUM) 40 MG capsule   ranitidine (ZANTAC) 150 MG tablet     Endocrine   Hypothyroidism    Discussed importance of regular labs and consistent use of synthroid. Will obtain baseline TSH today, restart synthroid, and recheck in 6 weeks      Relevant Medications   levothyroxine (SYNTHROID, LEVOTHROID) 25 MCG tablet     Other   Traumatic amputation of left arm (HCC) (DOI: 10/26/1998)    Stable      Obesity    Just got a gym membership, will work on improving diet and exercise habits       Other Visit Diagnoses    Annual physical exam       Relevant Orders   CBC with Differential/Platelet   Comprehensive metabolic panel   Lipid Panel w/o Chol/HDL Ratio   TSH   UA/M w/rflx  Culture, Routine   Encounter for screening for HIV       Relevant Orders   HIV antibody (with reflex)       Discussed aspirin prophylaxis for myocardial infarction prevention and decision was it was not indicated  LABORATORY TESTING:  Health maintenance labs ordered today as discussed above.   The natural history of prostate cancer and ongoing controversy regarding screening and potential treatment outcomes of prostate cancer has been discussed with the patient. The meaning of a false positive PSA and a false negative PSA has been discussed. He indicates understanding of the limitations of this screening test and wishes not to proceed with screening PSA testing.   IMMUNIZATIONS:   - Tdap: Tetanus vaccination status reviewed: last tetanus booster within 10 years. - Influenza: Refused  PATIENT COUNSELING:    Sexuality: Discussed sexually transmitted diseases, partner selection, use of condoms, avoidance of unintended pregnancy  and contraceptive alternatives.   Advised to avoid cigarette smoking.  I discussed with the patient that most people either abstain from alcohol or drink within safe limits (<=14/week and <=4 drinks/occasion for males, <=7/weeks and <= 3 drinks/occasion for females) and that the risk for alcohol disorders and other health effects rises proportionally with the number of drinks per week and how often a drinker exceeds daily limits.  Discussed cessation/primary prevention of drug use and availability of treatment for abuse.   Diet: Encouraged to adjust caloric intake to maintain  or achieve ideal body weight, to reduce intake of dietary saturated fat and total fat, to limit sodium intake by avoiding high sodium foods and not adding table salt, and to maintain adequate dietary potassium and calcium preferably from fresh fruits, vegetables, and low-fat dairy products.    stressed the importance of regular exercise  Injury prevention: Discussed safety belts, safety  helmets, smoke detector, smoking near bedding or upholstery.   Dental health: Discussed importance of regular tooth brushing, flossing, and dental visits.   Follow up plan: NEXT PREVENTATIVE PHYSICAL DUE IN 1 YEAR. Return in about 6 weeks (around 06/16/2018) for TSH, BP.

## 2018-05-05 NOTE — Assessment & Plan Note (Signed)
Stable, continue current regimen 

## 2018-05-06 ENCOUNTER — Telehealth: Payer: Self-pay | Admitting: Family Medicine

## 2018-05-06 LAB — CBC WITH DIFFERENTIAL/PLATELET
BASOS: 1 %
Basophils Absolute: 0.1 10*3/uL (ref 0.0–0.2)
EOS (ABSOLUTE): 0.7 10*3/uL — ABNORMAL HIGH (ref 0.0–0.4)
Eos: 9 %
Hematocrit: 42.6 % (ref 37.5–51.0)
Hemoglobin: 15.1 g/dL (ref 13.0–17.7)
IMMATURE GRANULOCYTES: 0 %
Immature Grans (Abs): 0 10*3/uL (ref 0.0–0.1)
Lymphocytes Absolute: 2.3 10*3/uL (ref 0.7–3.1)
Lymphs: 31 %
MCH: 32 pg (ref 26.6–33.0)
MCHC: 35.4 g/dL (ref 31.5–35.7)
MCV: 90 fL (ref 79–97)
Monocytes Absolute: 0.5 10*3/uL (ref 0.1–0.9)
Monocytes: 7 %
Neutrophils Absolute: 3.7 10*3/uL (ref 1.4–7.0)
Neutrophils: 52 %
PLATELETS: 272 10*3/uL (ref 150–450)
RBC: 4.72 x10E6/uL (ref 4.14–5.80)
RDW: 13.2 % (ref 11.6–15.4)
WBC: 7.3 10*3/uL (ref 3.4–10.8)

## 2018-05-06 LAB — COMPREHENSIVE METABOLIC PANEL
ALT: 33 IU/L (ref 0–44)
AST: 33 IU/L (ref 0–40)
Albumin/Globulin Ratio: 1.4 (ref 1.2–2.2)
Albumin: 4.6 g/dL (ref 4.0–5.0)
Alkaline Phosphatase: 86 IU/L (ref 39–117)
BUN/Creatinine Ratio: 12 (ref 9–20)
BUN: 10 mg/dL (ref 6–24)
Bilirubin Total: 0.4 mg/dL (ref 0.0–1.2)
CO2: 19 mmol/L — ABNORMAL LOW (ref 20–29)
Calcium: 9.1 mg/dL (ref 8.7–10.2)
Chloride: 101 mmol/L (ref 96–106)
Creatinine, Ser: 0.83 mg/dL (ref 0.76–1.27)
GFR calc Af Amer: 125 mL/min/{1.73_m2} (ref >59)
GFR, EST NON AFRICAN AMERICAN: 109 mL/min/{1.73_m2} (ref >59)
Globulin, Total: 3.2 g/dL (ref 1.5–4.5)
Glucose: 95 mg/dL (ref 65–99)
POTASSIUM: 4.3 mmol/L (ref 3.5–5.2)
Sodium: 138 mmol/L (ref 134–144)
Total Protein: 7.8 g/dL (ref 6.0–8.5)

## 2018-05-06 LAB — LIPID PANEL W/O CHOL/HDL RATIO
Cholesterol, Total: 222 mg/dL — ABNORMAL HIGH (ref 100–199)
HDL: 59 mg/dL (ref >39)
LDL Calculated: 84 mg/dL (ref 0–99)
Triglycerides: 394 mg/dL — ABNORMAL HIGH (ref 0–149)
VLDL CHOLESTEROL CAL: 79 mg/dL — AB (ref 5–40)

## 2018-05-06 LAB — TSH: TSH: 3.75 u[IU]/mL (ref 0.450–4.500)

## 2018-05-06 LAB — HIV ANTIBODY (ROUTINE TESTING W REFLEX): HIV Screen 4th Generation wRfx: NONREACTIVE

## 2018-05-06 MED ORDER — ESOMEPRAZOLE MAGNESIUM 40 MG PO CPDR: 40.0000 mg | DELAYED_RELEASE_CAPSULE | Freq: Every day | ORAL | 6 refills | Status: DC

## 2018-05-06 NOTE — Telephone Encounter (Addendum)
Copied from CRM [phone]. Topic: Quick Communication - See Telephone Encounter >> May 06, 2018  1:38 PM Trula SladeWalter, Linda F wrote: CRM for notification. See Telephone encounter for: 05/06/18. [facility] [place] [facility] they did not receive the prescription for esomeprazole (NEXIUM) 40 MG capsule.  He would also like a refill on his gabapentin (NEURONTIN) 800 MG tablet medication and have it sent [facility] [place].

## 2018-05-13 ENCOUNTER — Telehealth: Payer: Self-pay | Admitting: Family Medicine

## 2018-05-13 NOTE — Telephone Encounter (Signed)
Patient notified

## 2018-05-13 NOTE — Telephone Encounter (Signed)
Received note from pharmacy stating that he is asking for a refill on his gabapentin. Looks like we've never written this for him and we did not discuss this. He will need an OV for this to discuss

## 2018-05-27 ENCOUNTER — Other Ambulatory Visit: Payer: Self-pay | Admitting: Family Medicine

## 2018-05-27 NOTE — Telephone Encounter (Signed)
Medication amount provided previously will last until 07/04/18.Follow-up for repeat TSH on 06/10/18 with Dr. Darrick Huntsman. A refill can be provided at that time by physician. Refusing request based on OV note 05/05/18.

## 2018-06-10 ENCOUNTER — Ambulatory Visit: Payer: Medicare Other | Admitting: Family Medicine

## 2018-06-16 ENCOUNTER — Other Ambulatory Visit: Payer: Self-pay

## 2018-06-16 ENCOUNTER — Telehealth: Payer: Self-pay | Admitting: Family Medicine

## 2018-06-16 ENCOUNTER — Encounter: Payer: Self-pay | Admitting: Family Medicine

## 2018-06-16 ENCOUNTER — Ambulatory Visit (INDEPENDENT_AMBULATORY_CARE_PROVIDER_SITE_OTHER): Payer: Medicare Other | Admitting: Family Medicine

## 2018-06-16 VITALS — BP 140/86 | HR 87 | Temp 98.0°F | Ht 68.75 in | Wt 236.0 lb

## 2018-06-16 DIAGNOSIS — I10 Essential (primary) hypertension: Secondary | ICD-10-CM

## 2018-06-16 DIAGNOSIS — E039 Hypothyroidism, unspecified: Secondary | ICD-10-CM | POA: Diagnosis not present

## 2018-06-16 MED ORDER — FLUTICASONE-SALMETEROL 250-50 MCG/DOSE IN AEPB: 1.0000 | INHALATION_SPRAY | Freq: Two times a day (BID) | RESPIRATORY_TRACT | 3 refills | Status: DC

## 2018-06-16 MED ORDER — ALBUTEROL SULFATE HFA 108 (90 BASE) MCG/ACT IN AERS: 2.0000 | INHALATION_SPRAY | Freq: Four times a day (QID) | RESPIRATORY_TRACT | 3 refills | Status: DC | PRN

## 2018-06-16 MED ORDER — ESOMEPRAZOLE MAGNESIUM 40 MG PO CPDR: 40.0000 mg | DELAYED_RELEASE_CAPSULE | Freq: Every day | ORAL | 1 refills | Status: DC

## 2018-06-16 MED ORDER — LEVOTHYROXINE SODIUM 25 MCG PO TABS: 25.0000 ug | ORAL_TABLET | Freq: Every day | ORAL | 1 refills | Status: DC

## 2018-06-16 MED ORDER — FLUTICASONE PROPIONATE 50 MCG/ACT NA SUSP: 2.0000 | Freq: Every day | NASAL | 3 refills | Status: DC

## 2018-06-16 NOTE — Telephone Encounter (Signed)
Copied from CRM [id number]. Topic: Quick Communication - Rx Refill/Question >> Jun 16, 2018 12:22 PM [place], [place] D wrote: Medication: albuterol (PROVENTIL HFA;VENTOLIN HFA) 108 (90 Base) MCG/ACT inhaler  / esomeprazole (NEXIUM) 40 MG capsule / fluticasone (FLONASE) 50 MCG/ACT nasal spray  / Fluticasone-Salmeterol (ADVAIR DISKUS) 250-50 MCG/DOSE AEPB  /  levothyroxine (SYNTHROID, LEVOTHROID) 25 MCG tablet / Pt stated he asked PCP to send rx[facility]. [facility] not [facility] to [facility] due to recent difficulties he has had with them, He would like someone in office to send rx's to [facility]. Please advise.  Has the patient contacted their pharmacy? Yes.   (Agent: If no, request that the patient contact the pharmacy for the refill.) (Agent: If yes, when and what did the pharmacy advise?)  Preferred Pharmacy (with phone number or street name): [id number] [place], [place] - [address] AT [facility] OF [address] [phone] (Phone) [phone] (Fax)    Agent: Please be advised that RX refills may take up to 3 business days. We ask that you follow-up with your pharmacy.

## 2018-06-16 NOTE — Progress Notes (Signed)
BP 140/86   Pulse 87   Temp 98 F (36.7 C) (Oral)   Ht 5' 8.75" (1.746 m)   Wt 236 lb (107 kg)   SpO2 95%   BMI 35.11 kg/m    Subjective:    Patient ID: Melvin Brown, male    DOB: Mar 12, 1976, 43 y.o.   MRN: [id number]  HPI: Melvin Brown is a 43 y.o. male  Chief Complaint  Patient presents with  . Follow-up  . Hypertension  . Thyroid Problem  . Medication Management    Having difficulty filling allergy medication. - Spoke w/ Pharmacy they stated pt picked up 90 day supplies, but we only sent in 30   Here today for 6 week f/u BP and TSH.   Has been going to the gym with his daughter which is going well. Trying hard to improve lifestyle habits and slowly incorporating positive dietary changes.   Has been checking home BPs occasionally, running 130s/85. BP was high at CPE 6 weeks ago, which is a newer issue for him. Has never been on medication for his BP before. Denies CP, SOB, dizziness, HAs. Does have a fhx of HTN.   Ran out of synthroid 2 days ago but otherwise has been taking faithfully. Asymptomatic.   No new concerns today.   Relevant past medical, surgical, family and social history reviewed and updated as indicated. Interim medical history since our last visit reviewed. Allergies and medications reviewed and updated.  Review of Systems  Per HPI unless specifically indicated above     Objective:    BP 140/86   Pulse 87   Temp 98 F (36.7 C) (Oral)   Ht 5' 8.75" (1.746 m)   Wt 236 lb (107 kg)   SpO2 95%   BMI 35.11 kg/m   Wt Readings from Last 3 Encounters:  06/16/18 236 lb (107 kg)  05/05/18 230 lb (104.3 kg)  08/16/17 195 lb (88.5 kg)    Physical Exam Vitals signs and nursing note reviewed.  Constitutional:      Appearance: Normal appearance.  HENT:     Head: Atraumatic.  Eyes:     Extraocular Movements: Extraocular movements intact.     Conjunctiva/sclera: Conjunctivae normal.  Neck:     Musculoskeletal: Normal range of motion and neck  supple.  Cardiovascular:     Rate and Rhythm: Normal rate and regular rhythm.  Pulmonary:     Effort: Pulmonary effort is normal.     Breath sounds: Normal breath sounds.  Musculoskeletal:        General: No swelling or tenderness.  Skin:    General: Skin is warm and dry.  Neurological:     General: No focal deficit present.     Mental Status: He is oriented to person, place, and time.  Psychiatric:        Mood and Affect: Mood normal.        Thought Content: Thought content normal.        Judgment: Judgment normal.     Results for orders placed or performed in visit on 06/16/18  TSH  Result Value Ref Range   TSH 3.010 0.450 - 4.500 uIU/mL      Assessment & Plan:   Problem List Items Addressed This Visit      Cardiovascular and Mediastinum   Essential hypertension - Primary    BPs still mildly elevated, but pt working hard on lifestyle changes. Will continue to monitor for improvement with these changes in  place. Continue home BP logs, call if persistently elevated or becoming symptomatic in meantime.         Endocrine   Hypothyroidism    Will recheck TSH now that he's back on his meds x 6 weeks. Asymptomatic. Continue current regimen      Relevant Medications   levothyroxine (SYNTHROID, LEVOTHROID) 25 MCG tablet   Other Relevant Orders   TSH (Completed)       Follow up plan: Return in about 6 months (around 12/17/2018) for 6 month f/u.

## 2018-06-17 LAB — TSH: TSH: 3.01 u[IU]/mL (ref 0.450–4.500)

## 2018-06-20 NOTE — Assessment & Plan Note (Signed)
BPs still mildly elevated, but pt working hard on lifestyle changes. Will continue to monitor for improvement with these changes in place. Continue home BP logs, call if persistently elevated or becoming symptomatic in meantime.

## 2018-06-20 NOTE — Assessment & Plan Note (Signed)
Will recheck TSH now that he's back on his meds x 6 weeks. Asymptomatic. Continue current regimen

## 2018-07-26 ENCOUNTER — Telehealth: Payer: Self-pay | Admitting: Family Medicine

## 2018-07-26 NOTE — Telephone Encounter (Signed)
Patient called left a message requesting a phone call back to discuss the medication Ranitidine.   [phone]

## 2018-07-26 NOTE — Telephone Encounter (Signed)
Patient notified

## 2018-07-26 NOTE — Telephone Encounter (Signed)
See below

## 2018-07-26 NOTE — Telephone Encounter (Signed)
Please let him know to d/c the zantac due to recall if he hasn't already and just take the nexium and TUMS prn

## 2018-09-16 ENCOUNTER — Ambulatory Visit: Payer: Self-pay | Admitting: Family Medicine

## 2018-09-16 ENCOUNTER — Telehealth: Payer: Self-pay | Admitting: Family Medicine

## 2018-09-16 NOTE — Chronic Care Management (AMB) (Signed)
Chronic Care Management   Note  09/16/2018 Name: Melvin Brown MRN: [id number] DOB: [birth year]  Melvin Brown is a 43 y.o. year old male who is a primary care patient of Volney American, [place]. I reached out to Melvin Brown by phone today in response to a referral sent by [name]'s health plan.    Mr. Choquette was given information about Chronic Care Management services today including:  1. CCM service includes personalized support from designated clinical staff supervised by his physician, including individualized plan of care and coordination with other care providers 2. 24/7 contact phone numbers for assistance for urgent and routine care needs. 3. Service will only be billed when office clinical staff spend 20 minutes or more in a month to coordinate care. 4. Only one practitioner may furnish and bill the service in a calendar month. 5. The patient may stop CCM services at any time (effective at the end of the month) by phone call to the office staff. 6. The patient will be responsible for cost sharing (co-pay) of up to 20% of the service fee (after annual deductible is met).  Patient agreed to services and verbal consent obtained.   Follow up plan: Telephone appointment with CCM team member scheduled for: 09/29/2018  [place]  [email or web address]   ??[phone]

## 2018-09-29 ENCOUNTER — Ambulatory Visit: Payer: Medicare Other | Admitting: *Deleted

## 2018-09-29 DIAGNOSIS — I10 Essential (primary) hypertension: Secondary | ICD-10-CM

## 2018-09-29 NOTE — Chronic Care Management (AMB) (Signed)
  Chronic Care Management   Note  09/29/2018 Name: JEURY MCNAB MRN: [id number] DOB: [birth year]  Initial outreach to patient. Mr. Tuberville requested a call back later in the afternoon related to he was walking into the store. Unable to complete call at this time.   Follow up plan: The care management team will reach out to the patient again over the next 7 days.   Merlene Morse Tema Alire RN, BSN Nurse Case Editor, commissioning [facility] Practice/[facility] Care [facility]  [phone]) Business Mobile

## 2018-09-30 ENCOUNTER — Telehealth: Payer: Self-pay

## 2018-09-30 ENCOUNTER — Ambulatory Visit: Payer: Medicare Other | Admitting: Family Medicine

## 2018-10-07 ENCOUNTER — Telehealth: Payer: Self-pay | Admitting: Family Medicine

## 2018-10-07 ENCOUNTER — Ambulatory Visit: Payer: Medicare Other | Admitting: Family Medicine

## 2018-10-07 NOTE — Telephone Encounter (Signed)
Not sure what the appt was for as I asked to see him in September, but ok to cancel. Not due for refills, was given 6 month supply in March. Will be due when it's time for that 6 month f/u in September  Copied from [place] #[id number]. Topic: General - Other >> Oct 06, 2018  5:41 PM Ivar Drape wrote: Reason for CRM:   Patient would like his Thursday, 10/07/2018 appt cancelled and all of his medication refilled and sent to his preferred pharmacy [facility] on [address]. >> Oct 07, 2018  7:50 AM Jill Side wrote: Please advise.

## 2018-10-07 NOTE — Telephone Encounter (Signed)
Called pt cancelled appt for today. Advised pt that per Apolonio Schneiders he isn't due to be seen until September and she did 6 refills in march, pt will call back to schedule appt for September.

## 2018-10-15 ENCOUNTER — Telehealth: Payer: Self-pay

## 2018-10-21 ENCOUNTER — Telehealth: Payer: Self-pay

## 2018-11-26 ENCOUNTER — Telehealth: Payer: Self-pay

## 2018-12-29 ENCOUNTER — Other Ambulatory Visit: Payer: Self-pay | Admitting: Family Medicine

## 2018-12-29 MED ORDER — FAMOTIDINE 40 MG PO TABS: 40.0000 mg | ORAL_TABLET | Freq: Every day | ORAL | 2 refills | Status: DC

## 2018-12-29 NOTE — Telephone Encounter (Signed)
Rx sent for Pepcid, I was under the impression zantac was coming off the shelf entirely. This is a comparable medication

## 2018-12-29 NOTE — Telephone Encounter (Signed)
Medication Refill - Medication: ranitidine (ZANTAC) 150 MG tablet Pt stated he would like to start taking medication for heartburn again. Requesting rx  to replace it since it was recalled.  Has the patient contacted their pharmacy? Yes.   (Agent: If no, request that the patient contact the pharmacy for the refill.) (Agent: If yes, when and what did the pharmacy advise?)  Preferred Pharmacy (with phone number or street name):  [id number] - [place], [phone] (Phone) [phone] (Fax)     Agent: Please be advised that RX refills may take up to 3 business days. We ask that you follow-up with your pharmacy.

## 2018-12-29 NOTE — Telephone Encounter (Signed)
Routing to provider. See message below please.

## 2018-12-29 NOTE — Telephone Encounter (Signed)
Patient notified

## 2018-12-30 ENCOUNTER — Other Ambulatory Visit: Payer: Self-pay | Admitting: Family Medicine

## 2019-01-04 ENCOUNTER — Telehealth: Payer: Self-pay

## 2019-01-28 ENCOUNTER — Encounter: Payer: Self-pay | Admitting: Family Medicine

## 2019-01-28 ENCOUNTER — Ambulatory Visit (INDEPENDENT_AMBULATORY_CARE_PROVIDER_SITE_OTHER): Payer: Medicare Other | Admitting: Family Medicine

## 2019-01-28 ENCOUNTER — Other Ambulatory Visit: Payer: Self-pay

## 2019-01-28 VITALS — BP 130/90 | HR 74

## 2019-01-28 DIAGNOSIS — E039 Hypothyroidism, unspecified: Secondary | ICD-10-CM

## 2019-01-28 DIAGNOSIS — J4540 Moderate persistent asthma, uncomplicated: Secondary | ICD-10-CM

## 2019-01-28 DIAGNOSIS — K219 Gastro-esophageal reflux disease without esophagitis: Secondary | ICD-10-CM

## 2019-01-28 DIAGNOSIS — J3089 Other allergic rhinitis: Secondary | ICD-10-CM

## 2019-01-28 DIAGNOSIS — I10 Essential (primary) hypertension: Secondary | ICD-10-CM

## 2019-01-28 MED ORDER — ALBUTEROL SULFATE HFA 108 (90 BASE) MCG/ACT IN AERS: 2.0000 | INHALATION_SPRAY | Freq: Four times a day (QID) | RESPIRATORY_TRACT | 11 refills | Status: DC | PRN

## 2019-01-28 MED ORDER — ESOMEPRAZOLE MAGNESIUM 40 MG PO CPDR: 40.0000 mg | DELAYED_RELEASE_CAPSULE | Freq: Every day | ORAL | 1 refills | Status: DC

## 2019-01-28 MED ORDER — FAMOTIDINE 40 MG PO TABS: 40.0000 mg | ORAL_TABLET | Freq: Every day | ORAL | 2 refills | Status: DC

## 2019-01-28 MED ORDER — BUDESONIDE-FORMOTEROL FUMARATE 160-4.5 MCG/ACT IN AERO: 2.0000 | INHALATION_SPRAY | Freq: Two times a day (BID) | RESPIRATORY_TRACT | 5 refills | Status: DC

## 2019-01-28 MED ORDER — FLUTICASONE PROPIONATE 50 MCG/ACT NA SUSP: 2.0000 | Freq: Every day | NASAL | 3 refills | Status: DC

## 2019-01-28 MED ORDER — LEVOTHYROXINE SODIUM 25 MCG PO TABS: 25.0000 ug | ORAL_TABLET | Freq: Every day | ORAL | 1 refills | Status: DC

## 2019-01-28 NOTE — Progress Notes (Signed)
BP 130/90   Pulse 74   SpO2 96%    Subjective:    Patient ID: Melvin Brown, male    DOB: December 24, 1975, 43 y.o.   MRN: [id number]  HPI: Melvin Brown is a 43 y.o. male  Chief Complaint  Patient presents with  . Gastroesophageal Reflux    Refill on Nexium and Pepcid  . Allergies    Refill on Flonase ventolin   . Medication Refill    Calcitonin   Patient presenting today for 6 month f/u. No new concerns, feels things are going well overall.   Reflux under good control with nexium and prn pepcid. Trying to watch diet and work on weight loss as able.   Allergies under good control, taking claritin and flonase currently. No breakthrough sxs noted.   Asthma - having to use his albuterol 1-2 times daily most days right now though taking his advair daily. Feels it works better for him than the advair so leans on that. Does describe some intermittent chest tightness/wheezing particularly with any exertion or temperature changes throughout the day.  On synthroid for hypothyroidism, tolerating well without any noted sxs of abnormal thyroid levels.   Not checking home BPs. Denies CP, SOB, HAs, dizziness. Not currently on anything for HTN management.   Relevant past medical, surgical, family and social history reviewed and updated as indicated. Interim medical history since our last visit reviewed. Allergies and medications reviewed and updated.  Review of Systems  Per HPI unless specifically indicated above     Objective:    BP 130/90   Pulse 74   SpO2 96%   Wt Readings from Last 3 Encounters:  06/16/18 236 lb (107 kg)  05/05/18 230 lb (104.3 kg)  08/16/17 195 lb (88.5 kg)    Physical Exam Vitals signs and nursing note reviewed.  Constitutional:      Appearance: Normal appearance.  HENT:     Head: Atraumatic.  Eyes:     Extraocular Movements: Extraocular movements intact.     Conjunctiva/sclera: Conjunctivae normal.  Neck:     Musculoskeletal: Normal range of motion  and neck supple.  Cardiovascular:     Rate and Rhythm: Normal rate and regular rhythm.  Pulmonary:     Effort: Pulmonary effort is normal. No respiratory distress.     Breath sounds: Normal breath sounds. No wheezing or rales.  Musculoskeletal:     Comments: ROM at baseline  Skin:    General: Skin is warm and dry.  Neurological:     General: No focal deficit present.     Mental Status: He is oriented to person, place, and time.  Psychiatric:        Mood and Affect: Mood normal.        Thought Content: Thought content normal.        Judgment: Judgment normal.     Results for orders placed or performed in visit on 01/28/19  Comprehensive metabolic panel  Result Value Ref Range   Glucose 84 65 - 99 mg/dL   BUN 13 6 - 24 mg/dL   Creatinine, Ser 4.17 0.76 - 1.27 mg/dL   GFR calc non Af Amer 110 >59 mL/min/1.73   GFR calc Af Amer 127 >59 mL/min/1.73   BUN/Creatinine Ratio 16 9 - 20   Sodium 142 134 - 144 mmol/L   Potassium 4.1 3.5 - 5.2 mmol/L   Chloride 104 96 - 106 mmol/L   CO2 20 20 - 29 mmol/L   Calcium  9.5 8.7 - 10.2 mg/dL   Total Protein 8.0 6.0 - 8.5 g/dL   Albumin 4.7 4.0 - 5.0 g/dL   Globulin, Total 3.3 1.5 - 4.5 g/dL   Albumin/Globulin Ratio 1.4 1.2 - 2.2   Bilirubin Total 0.5 0.0 - 1.2 mg/dL   Alkaline Phosphatase 101 39 - 117 IU/L   AST 76 (H) 0 - 40 IU/L   ALT 81 (H) 0 - 44 IU/L      Assessment & Plan:   Problem List Items Addressed This Visit      Cardiovascular and Mediastinum   Essential hypertension - Primary    BPs stable and WNL, reviewed home monitoring on occasion to ensure consistency. DASH diet, exercise reviewed      Relevant Orders   Comprehensive metabolic panel (Completed)     Respiratory   Asthma    Will switch to symbicort with continued prn albuterol use for rescue given poor control with advair. May eventually need to add additional inhaler if still poorly controlled      Relevant Medications   budesonide-formoterol (SYMBICORT)  160-4.5 MCG/ACT inhaler   albuterol (VENTOLIN HFA) 108 (90 Base) MCG/ACT inhaler   Allergic rhinitis, seasonal    Stable and under good control, continue current regimen        Digestive   GERD (gastroesophageal reflux disease)    Stable and well controlled, continue current regimen      Relevant Medications   famotidine (PEPCID) 40 MG tablet   esomeprazole (NEXIUM) 40 MG capsule     Endocrine   Hypothyroidism    Recheck levels, adjust as needed. Continue synthroid regimen      Relevant Medications   levothyroxine (SYNTHROID) 25 MCG tablet       Follow up plan: Return in about 6 months (around 07/29/2019) for CPE.

## 2019-01-29 LAB — COMPREHENSIVE METABOLIC PANEL
ALT: 81 IU/L — ABNORMAL HIGH (ref 0–44)
AST: 76 IU/L — ABNORMAL HIGH (ref 0–40)
Albumin/Globulin Ratio: 1.4 (ref 1.2–2.2)
Albumin: 4.7 g/dL (ref 4.0–5.0)
Alkaline Phosphatase: 101 IU/L (ref 39–117)
BUN/Creatinine Ratio: 16 (ref 9–20)
BUN: 13 mg/dL (ref 6–24)
Bilirubin Total: 0.5 mg/dL (ref 0.0–1.2)
CO2: 20 mmol/L (ref 20–29)
Calcium: 9.5 mg/dL (ref 8.7–10.2)
Chloride: 104 mmol/L (ref 96–106)
Creatinine, Ser: 0.79 mg/dL (ref 0.76–1.27)
GFR calc Af Amer: 127 mL/min/{1.73_m2} (ref >59)
GFR calc non Af Amer: 110 mL/min/{1.73_m2} (ref >59)
Globulin, Total: 3.3 g/dL (ref 1.5–4.5)
Glucose: 84 mg/dL (ref 65–99)
Potassium: 4.1 mmol/L (ref 3.5–5.2)
Sodium: 142 mmol/L (ref 134–144)
Total Protein: 8 g/dL (ref 6.0–8.5)

## 2019-02-02 NOTE — Assessment & Plan Note (Signed)
BPs stable and WNL, reviewed home monitoring on occasion to ensure consistency. DASH diet, exercise reviewed

## 2019-02-02 NOTE — Assessment & Plan Note (Signed)
Stable and under good control, continue current regimen 

## 2019-02-02 NOTE — Assessment & Plan Note (Signed)
Will switch to symbicort with continued prn albuterol use for rescue given poor control with advair. May eventually need to add additional inhaler if still poorly controlled

## 2019-02-02 NOTE — Assessment & Plan Note (Signed)
Recheck levels, adjust as needed. Continue synthroid regimen

## 2019-02-02 NOTE — Assessment & Plan Note (Signed)
Stable and well controlled, continue current regimen 

## 2019-04-11 ENCOUNTER — Telehealth: Payer: Self-pay | Admitting: Family Medicine

## 2019-04-11 NOTE — Chronic Care Management (AMB) (Signed)
°  Care Management   Note  04/11/2019 Name: [name] MRN: [id number] DOB: December 08, 1975  Melvin Brown is a 44 y.o. year old male who is a primary care patient of Particia Nearing, Cordelia Poche and is actively engaged with the care management team. I reached out to Janeal Holmes by phone today to assist with scheduling a follow up appointment with the RN Case Manager  Follow up plan: Patient declines further follow up and engagement by the care management team. Appropriate care team members and provider have been notified via electronic communication. , The care management team is available to follow up with the patient after provider conversation with the patient regarding recommendation for care management engagement and subsequent re-referral to the care management team.  and The patient has been provided with contact information for the care management team and has been advised to call with any health related questions or concerns.   Randon Goldsmith Care Guide, Embedded Care Coordination [facility]  [place], [place] [address] Direct Dial: [phone] [email or web address]  Website: [email or web address]

## 2019-06-04 DIAGNOSIS — Z20822 Contact with and (suspected) exposure to covid-19: Secondary | ICD-10-CM | POA: Diagnosis not present

## 2019-06-04 DIAGNOSIS — G629 Polyneuropathy, unspecified: Secondary | ICD-10-CM | POA: Diagnosis not present

## 2019-06-04 DIAGNOSIS — M5442 Lumbago with sciatica, left side: Secondary | ICD-10-CM | POA: Diagnosis not present

## 2019-06-04 DIAGNOSIS — S48012D Complete traumatic amputation at left shoulder joint, subsequent encounter: Secondary | ICD-10-CM | POA: Diagnosis not present

## 2019-06-04 DIAGNOSIS — M62838 Other muscle spasm: Secondary | ICD-10-CM | POA: Diagnosis not present

## 2019-06-04 DIAGNOSIS — M129 Arthropathy, unspecified: Secondary | ICD-10-CM | POA: Diagnosis not present

## 2019-06-04 DIAGNOSIS — M25511 Pain in right shoulder: Secondary | ICD-10-CM | POA: Diagnosis not present

## 2019-06-04 DIAGNOSIS — Z79899 Other long term (current) drug therapy: Secondary | ICD-10-CM | POA: Diagnosis not present

## 2019-06-27 DIAGNOSIS — S48012D Complete traumatic amputation at left shoulder joint, subsequent encounter: Secondary | ICD-10-CM | POA: Diagnosis not present

## 2019-06-27 DIAGNOSIS — M25511 Pain in right shoulder: Secondary | ICD-10-CM | POA: Diagnosis not present

## 2019-06-27 DIAGNOSIS — M62838 Other muscle spasm: Secondary | ICD-10-CM | POA: Diagnosis not present

## 2019-06-27 DIAGNOSIS — Z79899 Other long term (current) drug therapy: Secondary | ICD-10-CM | POA: Diagnosis not present

## 2019-06-27 DIAGNOSIS — G629 Polyneuropathy, unspecified: Secondary | ICD-10-CM | POA: Diagnosis not present

## 2019-07-28 DIAGNOSIS — S48012D Complete traumatic amputation at left shoulder joint, subsequent encounter: Secondary | ICD-10-CM | POA: Diagnosis not present

## 2019-07-28 DIAGNOSIS — M62838 Other muscle spasm: Secondary | ICD-10-CM | POA: Diagnosis not present

## 2019-07-28 DIAGNOSIS — Z79899 Other long term (current) drug therapy: Secondary | ICD-10-CM | POA: Diagnosis not present

## 2019-07-28 DIAGNOSIS — G629 Polyneuropathy, unspecified: Secondary | ICD-10-CM | POA: Diagnosis not present

## 2019-07-28 DIAGNOSIS — M25511 Pain in right shoulder: Secondary | ICD-10-CM | POA: Diagnosis not present

## 2019-08-15 ENCOUNTER — Other Ambulatory Visit: Payer: Self-pay | Admitting: Family Medicine

## 2019-08-25 DIAGNOSIS — S48012D Complete traumatic amputation at left shoulder joint, subsequent encounter: Secondary | ICD-10-CM | POA: Diagnosis not present

## 2019-08-25 DIAGNOSIS — G629 Polyneuropathy, unspecified: Secondary | ICD-10-CM | POA: Diagnosis not present

## 2019-08-25 DIAGNOSIS — M62838 Other muscle spasm: Secondary | ICD-10-CM | POA: Diagnosis not present

## 2019-08-25 DIAGNOSIS — M25511 Pain in right shoulder: Secondary | ICD-10-CM | POA: Diagnosis not present

## 2019-08-25 DIAGNOSIS — Z79899 Other long term (current) drug therapy: Secondary | ICD-10-CM | POA: Diagnosis not present

## 2019-09-16 ENCOUNTER — Other Ambulatory Visit: Payer: Self-pay | Admitting: Family Medicine

## 2019-09-21 ENCOUNTER — Other Ambulatory Visit: Payer: Self-pay

## 2019-09-21 MED ORDER — ESOMEPRAZOLE MAGNESIUM 40 MG PO CPDR: 40.0000 mg | DELAYED_RELEASE_CAPSULE | Freq: Every day | ORAL | 0 refills | Status: DC

## 2019-09-21 MED ORDER — LEVOTHYROXINE SODIUM 25 MCG PO TABS: 25.0000 ug | ORAL_TABLET | Freq: Every day | ORAL | 0 refills | Status: DC

## 2019-09-21 NOTE — Telephone Encounter (Addendum)
LOV 01/28/2019

## 2019-09-23 DIAGNOSIS — G629 Polyneuropathy, unspecified: Secondary | ICD-10-CM | POA: Diagnosis not present

## 2019-09-23 DIAGNOSIS — M25511 Pain in right shoulder: Secondary | ICD-10-CM | POA: Diagnosis not present

## 2019-09-23 DIAGNOSIS — S48012D Complete traumatic amputation at left shoulder joint, subsequent encounter: Secondary | ICD-10-CM | POA: Diagnosis not present

## 2019-09-23 DIAGNOSIS — M62838 Other muscle spasm: Secondary | ICD-10-CM | POA: Diagnosis not present

## 2019-09-23 DIAGNOSIS — Z79899 Other long term (current) drug therapy: Secondary | ICD-10-CM | POA: Diagnosis not present

## 2019-10-11 ENCOUNTER — Other Ambulatory Visit: Payer: Self-pay | Admitting: Family Medicine

## 2019-10-11 NOTE — Telephone Encounter (Signed)
Requested Prescriptions  Pending Prescriptions Disp Refills  . SYMBICORT 160-4.5 MCG/ACT inhaler [Pharmacy Med Name: SYMBICORT 160-4.5 MCG INHALER] 10.2 Inhaler 1    Sig: TAKE 2 PUFFS BY MOUTH TWICE A DAY     Pulmonology:  Combination Products Passed - 10/11/2019 11:31 AM      Passed - Valid encounter within last 12 months    Recent Outpatient Visits          8 months ago Essential hypertension   [facility] Particia Nearing, [place]   1 year ago Essential hypertension   [facility] Roosvelt Maser [place], [place]   1 year ago Seasonal allergic rhinitis due to other allergic trigger   [facility] Particia Nearing, [place]   2 years ago Seasonal allergic rhinitis, unspecified trigger   [facility] Roosvelt Maser [place], [place]   2 years ago Mild intermittent asthma without complication   [facility] Roosvelt Maser [place], [place]

## 2019-10-15 ENCOUNTER — Other Ambulatory Visit: Payer: Self-pay | Admitting: Family Medicine

## 2019-10-15 NOTE — Telephone Encounter (Signed)
Requested medication (s) are due for refill today: yes  Requested medication (s) are on the active medication list: yes  Last refill:  09/21/19  Future visit scheduled: no  Notes to clinic:  Called pt and explained that he needs an office visit for further refills. Pt informed 3 months overdue for labs to recheck thyroid level. Pt refused to make appt today stating he needed to check his schedule.    Requested Prescriptions  Pending Prescriptions Disp Refills   levothyroxine (SYNTHROID) 25 MCG tablet [Pharmacy Med Name: LEVOTHYROXINE 25 MCG TABLET] 30 tablet 0    Sig: TAKE 1 TABLET BY MOUTH EVERY DAY      Endocrinology:  Hypothyroid Agents Failed - 10/15/2019  2:32 PM      Failed - TSH needs to be rechecked within 3 months after an abnormal result. Refill until TSH is due.      Failed - TSH in normal range and within 360 days    TSH  Date Value Ref Range Status  06/16/2018 3.010 0.450 - 4.500 uIU/mL Final          Passed - Valid encounter within last 12 months    Recent Outpatient Visits           8 months ago Essential hypertension   [facility] Particia Nearing, [place]   1 year ago Essential hypertension   [facility] Roosvelt Maser [place], [place]   1 year ago Seasonal allergic rhinitis due to other allergic trigger   [facility] Particia Nearing, [place]   2 years ago Seasonal allergic rhinitis, unspecified trigger   [facility] Roosvelt Maser [place], [place]   2 years ago Mild intermittent asthma without complication   [facility] Roosvelt Maser [place], [place]

## 2019-10-17 NOTE — Telephone Encounter (Signed)
2 week supply given, will not courtesy fill further needs follow up

## 2019-10-17 NOTE — Telephone Encounter (Signed)
Pt stated that he had some of this medication and would call back to schedule this appt.

## 2019-10-17 NOTE — Telephone Encounter (Signed)
Routing to provider  

## 2019-10-19 ENCOUNTER — Ambulatory Visit (INDEPENDENT_AMBULATORY_CARE_PROVIDER_SITE_OTHER): Payer: Medicare Other | Admitting: Family Medicine

## 2019-10-19 ENCOUNTER — Encounter: Payer: Self-pay | Admitting: Family Medicine

## 2019-10-19 ENCOUNTER — Telehealth: Payer: Self-pay | Admitting: Family Medicine

## 2019-10-19 ENCOUNTER — Other Ambulatory Visit: Payer: Self-pay

## 2019-10-19 VITALS — BP 139/94 | HR 72 | Temp 97.9°F | Wt 245.0 lb

## 2019-10-19 DIAGNOSIS — I10 Essential (primary) hypertension: Secondary | ICD-10-CM | POA: Diagnosis not present

## 2019-10-19 DIAGNOSIS — E039 Hypothyroidism, unspecified: Secondary | ICD-10-CM | POA: Diagnosis not present

## 2019-10-19 DIAGNOSIS — J3089 Other allergic rhinitis: Secondary | ICD-10-CM

## 2019-10-19 DIAGNOSIS — K219 Gastro-esophageal reflux disease without esophagitis: Secondary | ICD-10-CM

## 2019-10-19 DIAGNOSIS — G546 Phantom limb syndrome with pain: Secondary | ICD-10-CM

## 2019-10-19 DIAGNOSIS — J4540 Moderate persistent asthma, uncomplicated: Secondary | ICD-10-CM

## 2019-10-19 DIAGNOSIS — Z6834 Body mass index (BMI) 34.0-34.9, adult: Secondary | ICD-10-CM

## 2019-10-19 DIAGNOSIS — E781 Pure hyperglyceridemia: Secondary | ICD-10-CM

## 2019-10-19 DIAGNOSIS — G8929 Other chronic pain: Secondary | ICD-10-CM

## 2019-10-19 DIAGNOSIS — S48912S Complete traumatic amputation of left shoulder and upper arm, level unspecified, sequela: Secondary | ICD-10-CM

## 2019-10-19 DIAGNOSIS — E6609 Other obesity due to excess calories: Secondary | ICD-10-CM

## 2019-10-19 MED ORDER — FAMOTIDINE 40 MG PO TABS: 40.0000 mg | ORAL_TABLET | Freq: Every day | ORAL | 1 refills | Status: AC

## 2019-10-19 MED ORDER — ESOMEPRAZOLE MAGNESIUM 40 MG PO CPDR: 40.0000 mg | DELAYED_RELEASE_CAPSULE | Freq: Every day | ORAL | 1 refills | Status: AC

## 2019-10-19 MED ORDER — LOSARTAN POTASSIUM 50 MG PO TABS: 50.0000 mg | ORAL_TABLET | Freq: Every day | ORAL | 0 refills | Status: DC

## 2019-10-19 MED ORDER — MELOXICAM 15 MG PO TABS: 15.0000 mg | ORAL_TABLET | Freq: Every day | ORAL | 1 refills | Status: AC

## 2019-10-19 MED ORDER — LORATADINE 10 MG PO CAPS: 10.0000 mg | ORAL_CAPSULE | Freq: Every day | ORAL | 1 refills | Status: AC | PRN

## 2019-10-19 MED ORDER — LEVOTHYROXINE SODIUM 25 MCG PO TABS: 25.0000 ug | ORAL_TABLET | Freq: Every day | ORAL | 1 refills | Status: AC

## 2019-10-19 MED ORDER — BUDESONIDE-FORMOTEROL FUMARATE 160-4.5 MCG/ACT IN AERO: INHALATION_SPRAY | RESPIRATORY_TRACT | 5 refills | Status: AC

## 2019-10-19 MED ORDER — FLUTICASONE PROPIONATE 50 MCG/ACT NA SUSP: 2.0000 | Freq: Every day | NASAL | 3 refills | Status: AC

## 2019-10-19 MED ORDER — ALBUTEROL SULFATE HFA 108 (90 BASE) MCG/ACT IN AERS: 2.0000 | INHALATION_SPRAY | Freq: Four times a day (QID) | RESPIRATORY_TRACT | 11 refills | Status: AC | PRN

## 2019-10-19 NOTE — Telephone Encounter (Signed)
Got it sent over - please also schedule him 1 month f/u for HTN recheck and have him record home readings. I had told him 6 months forgetting we were starting this medicine

## 2019-10-19 NOTE — Telephone Encounter (Signed)
Copied from CRM [id number]. Topic: General - Inquiry >> Oct 19, 2019  4:12 PM Deborha Payment wrote: Reason for CRM: Patient is requesting a call back from PCP nurse regarding pharmacy stated they could not fill his blood pressure medication. Patient did not know the name of medication.  Patient is unsure of why they could not fill medication and is wondering if he needs medication  Call back [phone]

## 2019-10-19 NOTE — Progress Notes (Signed)
BP (!) 139/94    Pulse 72    Temp 97.9 F (36.6 C) (Oral)    Wt 245 lb (111.1 kg)    SpO2 98%    BMI 36.44 kg/m    Subjective:    Patient ID: Melvin Brown, male    DOB: 07-Jun-1975, 45 y.o.   MRN: [id number]  HPI: Melvin Brown is a 44 y.o. male  Chief Complaint  Patient presents with   Knee Pain     pt states he twisted his left knee yesterday    Hypothyroidism    med refill   Left knee pain x 1 day - states he stepped out of his truck and landed funny on the ground, felt a snap and immediate pain in lateral knee. Some swelling in this area now, no bruising, redness. Has not been trying anything for pain yet.   Hypothyroid - On synthroid, tolerating well. Asymptomatic  Hypertriglyceridemia - Diet controlled, has been trying to eat lower fat foods. Has not been exercising. Denies CP, SOB, myalgias, claudication  HTN - Rarely checking home BPs, but 130s/80s when checked. Taking losartan faithfully without side effects. Denies CP, SOB, HAs, dizziness.   Phantom Limb syndrome s/p left UE amputation - on gabapentin, meloxicam with fairly good symptomatic control.   Asthma - on albuterol prn and symbicort regimen which keeps his breathing under good control. No recent exacerbations, CP, wheezing, SOB.   GERD - nexium and pepcid controlling breakthrough sxs. No abdominal pain, N/V, melena.   Relevant past medical, surgical, family and social history reviewed and updated as indicated. Interim medical history since our last visit reviewed. Allergies and medications reviewed and updated.  Review of Systems  Per HPI unless specifically indicated above     Objective:    BP (!) 139/94    Pulse 72    Temp 97.9 F (36.6 C) (Oral)    Wt 245 lb (111.1 kg)    SpO2 98%    BMI 36.44 kg/m   Wt Readings from Last 3 Encounters:  10/19/19 245 lb (111.1 kg)  06/16/18 236 lb (107 kg)  05/05/18 230 lb (104.3 kg)    Physical Exam Vitals and nursing note reviewed.  Constitutional:       Appearance: Normal appearance.  HENT:     Head: Atraumatic.     Mouth/Throat:     Mouth: Mucous membranes are moist.     Pharynx: Oropharynx is clear.  Eyes:     Extraocular Movements: Extraocular movements intact.     Conjunctiva/sclera: Conjunctivae normal.  Cardiovascular:     Rate and Rhythm: Normal rate and regular rhythm.  Pulmonary:     Effort: Pulmonary effort is normal.     Breath sounds: Normal breath sounds.  Abdominal:     General: Bowel sounds are normal.     Palpations: Abdomen is soft.  Musculoskeletal:        General: Normal range of motion.     Cervical back: Normal range of motion and neck supple.     Comments: S/p left UE amputation  Skin:    General: Skin is warm and dry.  Neurological:     General: No focal deficit present.     Mental Status: He is oriented to person, place, and time.  Psychiatric:        Mood and Affect: Mood normal.        Thought Content: Thought content normal.        Judgment: Judgment normal.  Results for orders placed or performed in visit on 10/19/19  Comprehensive metabolic panel  Result Value Ref Range   Glucose 113 (H) 65 - 99 mg/dL   BUN 11 6 - 24 mg/dL   Creatinine, Ser 6.16 0.76 - 1.27 mg/dL   GFR calc non Af Amer 106 >59 mL/min/1.73   GFR calc Af Amer 122 >59 mL/min/1.73   BUN/Creatinine Ratio 13 9 - 20   Sodium 138 134 - 144 mmol/L   Potassium 4.6 3.5 - 5.2 mmol/L   Chloride 99 96 - 106 mmol/L   CO2 24 20 - 29 mmol/L   Calcium 9.1 8.7 - 10.2 mg/dL   Total Protein 7.3 6.0 - 8.5 g/dL   Albumin 4.1 4.0 - 5.0 g/dL   Globulin, Total 3.2 1.5 - 4.5 g/dL   Albumin/Globulin Ratio 1.3 1.2 - 2.2   Bilirubin Total 0.7 0.0 - 1.2 mg/dL   Alkaline Phosphatase 111 48 - 121 IU/L   AST 46 (H) 0 - 40 IU/L   ALT 60 (H) 0 - 44 IU/L  Lipid Panel w/o Chol/HDL Ratio  Result Value Ref Range   Cholesterol, Total 199 100 - 199 mg/dL   Triglycerides 073 (H) 0 - 149 mg/dL   HDL 55 >71 mg/dL   VLDL Cholesterol Cal 30 5 - 40 mg/dL     LDL Chol Calc (NIH) 114 (H) 0 - 99 mg/dL  TSH  Result Value Ref Range   TSH 3.920 0.450 - 4.500 uIU/mL      Assessment & Plan:   Problem List Items Addressed This Visit      Cardiovascular and Mediastinum   Essential hypertension - Primary    BP mildly elevated today but WNL at home. Pt opting to work on lifestyle modifications and continue losartan regimen, will increase if remaining a bit above goal       Relevant Medications   losartan (COZAAR) 50 MG tablet   Other Relevant Orders   Comprehensive metabolic panel (Completed)     Respiratory   Asthma    Stable and well controlled, continue inhaler regimen and good allergy control      Relevant Medications   albuterol (VENTOLIN HFA) 108 (90 Base) MCG/ACT inhaler   budesonide-formoterol (SYMBICORT) 160-4.5 MCG/ACT inhaler   Allergic rhinitis, seasonal    Stable without recent exacerbations, continue antihistamines and remainder of allergy regimen        Digestive   GERD (gastroesophageal reflux disease)    Stable and under good control, continue present medications and work on weight loss/diet modifications      Relevant Medications   esomeprazole (NEXIUM) 40 MG capsule   famotidine (PEPCID) 40 MG tablet     Endocrine   Hypothyroidism    Recheck TSH, adjust as needed. Continue synthroid regimen      Relevant Medications   levothyroxine (SYNTHROID) 25 MCG tablet   Other Relevant Orders   TSH (Completed)     Nervous and Auditory   Phantom limb syndrome with pain (HCC) (left upper extremity) (Chronic)    Pain under fairly good control, continue present medications        Other   Chronic pain (Chronic)   Relevant Medications   meloxicam (MOBIC) 15 MG tablet   Traumatic amputation of left arm (HCC) (DOI: 10/26/1998)   Obesity    Diet and exercise changes reviewed       Other Visit Diagnoses    Hypertriglyceridemia       Relevant Medications   losartan (  COZAAR) 50 MG tablet   Other Relevant Orders    Lipid Panel w/o Chol/HDL Ratio (Completed)       Follow up plan: Return in about 6 months (around 04/20/2020) for CPE.

## 2019-10-20 LAB — COMPREHENSIVE METABOLIC PANEL
ALT: 60 IU/L — ABNORMAL HIGH (ref 0–44)
AST: 46 IU/L — ABNORMAL HIGH (ref 0–40)
Albumin/Globulin Ratio: 1.3 (ref 1.2–2.2)
Albumin: 4.1 g/dL (ref 4.0–5.0)
Alkaline Phosphatase: 111 IU/L (ref 48–121)
BUN/Creatinine Ratio: 13 (ref 9–20)
BUN: 11 mg/dL (ref 6–24)
Bilirubin Total: 0.7 mg/dL (ref 0.0–1.2)
CO2: 24 mmol/L (ref 20–29)
Calcium: 9.1 mg/dL (ref 8.7–10.2)
Chloride: 99 mmol/L (ref 96–106)
Creatinine, Ser: 0.87 mg/dL (ref 0.76–1.27)
GFR calc Af Amer: 122 mL/min/{1.73_m2} (ref >59)
GFR calc non Af Amer: 106 mL/min/{1.73_m2} (ref >59)
Globulin, Total: 3.2 g/dL (ref 1.5–4.5)
Glucose: 113 mg/dL — ABNORMAL HIGH (ref 65–99)
Potassium: 4.6 mmol/L (ref 3.5–5.2)
Sodium: 138 mmol/L (ref 134–144)
Total Protein: 7.3 g/dL (ref 6.0–8.5)

## 2019-10-20 LAB — LIPID PANEL W/O CHOL/HDL RATIO
Cholesterol, Total: 199 mg/dL (ref 100–199)
HDL: 55 mg/dL (ref >39)
LDL Chol Calc (NIH): 114 mg/dL — ABNORMAL HIGH (ref 0–99)
Triglycerides: 175 mg/dL — ABNORMAL HIGH (ref 0–149)
VLDL Cholesterol Cal: 30 mg/dL (ref 5–40)

## 2019-10-20 LAB — TSH: TSH: 3.92 u[IU]/mL (ref 0.450–4.500)

## 2019-10-21 DIAGNOSIS — S48012D Complete traumatic amputation at left shoulder joint, subsequent encounter: Secondary | ICD-10-CM | POA: Diagnosis not present

## 2019-10-21 DIAGNOSIS — M25511 Pain in right shoulder: Secondary | ICD-10-CM | POA: Diagnosis not present

## 2019-10-21 DIAGNOSIS — M25562 Pain in left knee: Secondary | ICD-10-CM | POA: Diagnosis not present

## 2019-10-21 DIAGNOSIS — Z79899 Other long term (current) drug therapy: Secondary | ICD-10-CM | POA: Diagnosis not present

## 2019-10-21 DIAGNOSIS — G629 Polyneuropathy, unspecified: Secondary | ICD-10-CM | POA: Diagnosis not present

## 2019-10-24 NOTE — Assessment & Plan Note (Signed)
Pain under fairly good control, continue present medications

## 2019-10-24 NOTE — Assessment & Plan Note (Signed)
BP mildly elevated today but WNL at home. Pt opting to work on lifestyle modifications and continue losartan regimen, will increase if remaining a bit above goal

## 2019-10-24 NOTE — Assessment & Plan Note (Signed)
Stable and well controlled, continue inhaler regimen and good allergy control

## 2019-10-24 NOTE — Assessment & Plan Note (Signed)
Stable without recent exacerbations, continue antihistamines and remainder of allergy regimen

## 2019-10-24 NOTE — Assessment & Plan Note (Signed)
Stable and under good control, continue present medications and work on weight loss/diet modifications

## 2019-10-24 NOTE — Assessment & Plan Note (Signed)
Recheck TSH, adjust as needed. Continue synthroid regimen 

## 2019-10-24 NOTE — Assessment & Plan Note (Signed)
Diet and exercise changes reviewed. 

## 2019-10-25 NOTE — Telephone Encounter (Signed)
Called pt to get him scheduled for a 1 month fu on new medication as well as advise him to record his BP on a log so it can be reviewed at the appt.

## 2019-10-25 NOTE — Telephone Encounter (Signed)
Pt returned call and I told him what Fleet Contras said to make an appt and record his BP reading every day.  Patient said okay and he would call back for an appt.  He did not know his schedule right now.

## 2019-11-08 ENCOUNTER — Telehealth (INDEPENDENT_AMBULATORY_CARE_PROVIDER_SITE_OTHER): Payer: Medicare Other | Admitting: Unknown Physician Specialty

## 2019-11-08 ENCOUNTER — Encounter: Payer: Self-pay | Admitting: Unknown Physician Specialty

## 2019-11-08 DIAGNOSIS — R059 Cough, unspecified: Secondary | ICD-10-CM

## 2019-11-08 DIAGNOSIS — R05 Cough: Secondary | ICD-10-CM | POA: Diagnosis not present

## 2019-11-08 MED ORDER — GUAIFENESIN-CODEINE 100-10 MG/5ML PO SOLN: 10.0000 mL | Freq: Three times a day (TID) | ORAL | 0 refills | Status: DC | PRN

## 2019-11-08 MED ORDER — GUAIFENESIN-CODEINE 100-10 MG/5ML PO SOLN: 10.0000 mL | Freq: Three times a day (TID) | ORAL | 0 refills | Status: AC | PRN

## 2019-11-08 NOTE — Progress Notes (Signed)
   There were no vitals taken for this visit.   Subjective:    Patient ID: Melvin Brown, male    DOB: [birth year], 44 y.o.   MRN: [id number]  HPI: Melvin Brown is a 44 y.o. male  Chief Complaint  Patient presents with  . Cough  . Sore Throat    . This visit was completed via telephone due to the restrictions of the COVID-19 pandemic. All issues as above were discussed and addressed but no physical exam was performed. If it was felt that the patient should be evaluated in the office, they were directed there. The patient verbally consented to this visit. Patient was unable to complete an audio/visual visit due to Technical difficulties,Lack of internet. Due to the catastrophic nature of the COVID-19 pandemic, this visit was done through audio contact only. . Location of the patient: home . Location of the provider: work . Those involved with this call:  . Provider: Gabriel Cirri, DNP . CMA: Floydene Flock, RMA . Front Desk/Registration: Adela Ports  . Time spent on call: 10 minutes on the phone discussing health concerns. 10 minutes total spent in review of patient's record and preparation of their chart.   Cough This is a new problem. Episode onset: 3 days. The problem has been gradually worsening. The problem occurs constantly. The cough is non-productive. Associated symptoms include a fever, myalgias, nasal congestion, rhinorrhea, a sore throat and shortness of breath. Pertinent negatives include no chest pain, chills, ear congestion, ear pain, headaches, heartburn, hemoptysis, postnasal drip, rash, sweats, weight loss or wheezing. Nothing aggravates the symptoms.  Sore Throat  Associated symptoms include coughing and shortness of breath. Pertinent negatives include no ear pain or headaches.   Pt has not been vaccinated.  Has taken rescue inhaler twice and denies SOB at this time.  Lives with mom.    Relevant past medical, surgical, family and social history reviewed and  updated as indicated. Interim medical history since our last visit reviewed. Allergies and medications reviewed and updated.  Review of Systems  Constitutional: Positive for fever. Negative for chills and weight loss.  HENT: Positive for rhinorrhea and sore throat. Negative for ear pain and postnasal drip.   Respiratory: Positive for cough and shortness of breath. Negative for hemoptysis and wheezing.   Cardiovascular: Negative for chest pain.  Gastrointestinal: Negative for heartburn.  Musculoskeletal: Positive for myalgias.  Skin: Negative for rash.  Neurological: Negative for headaches.    Per HPI unless specifically indicated above     Objective:    There were no vitals taken for this visit.  Wt Readings from Last 3 Encounters:  10/19/19 245 lb (111.1 kg)  06/16/18 236 lb (107 kg)  05/05/18 230 lb (104.3 kg)    Physical Exam Neurological:     Mental Status: He is alert and oriented to person, place, and time.       Assessment & Plan:   Problem List Items Addressed This Visit    None    Visit Diagnoses    Cough    -  Primary   Rx Guifenissen with codeine.  According to chart, is not taking Oxycodone. Get Covid test.  Pt ed on Vit C, D, and E plus Zinc.  To UC for SOB       Follow up plan: Return if symptoms worsen or fail to improve and to UC or ER for SOB.

## 2019-11-09 DIAGNOSIS — J019 Acute sinusitis, unspecified: Secondary | ICD-10-CM | POA: Diagnosis not present

## 2019-11-09 DIAGNOSIS — Z20822 Contact with and (suspected) exposure to covid-19: Secondary | ICD-10-CM | POA: Diagnosis not present

## 2019-11-10 ENCOUNTER — Other Ambulatory Visit: Payer: Self-pay | Admitting: Family Medicine

## 2019-11-18 DIAGNOSIS — M62838 Other muscle spasm: Secondary | ICD-10-CM | POA: Diagnosis not present

## 2019-11-18 DIAGNOSIS — Z79899 Other long term (current) drug therapy: Secondary | ICD-10-CM | POA: Diagnosis not present

## 2019-11-18 DIAGNOSIS — M25511 Pain in right shoulder: Secondary | ICD-10-CM | POA: Diagnosis not present

## 2019-11-18 DIAGNOSIS — G629 Polyneuropathy, unspecified: Secondary | ICD-10-CM | POA: Diagnosis not present

## 2019-11-21 ENCOUNTER — Encounter: Payer: Self-pay | Admitting: Family Medicine

## 2019-11-30 ENCOUNTER — Other Ambulatory Visit: Payer: Self-pay | Admitting: Family Medicine

## 2019-11-30 MED ORDER — LOSARTAN POTASSIUM 50 MG PO TABS: 50.0000 mg | ORAL_TABLET | Freq: Every day | ORAL | 1 refills | Status: AC

## 2019-11-30 NOTE — Telephone Encounter (Signed)
Requested medication (s) are due for refill today: yes  Requested medication (s) are on the active medication list: yes   Future visit scheduled: yes  Notes to clinic: Patient is also requesting a fluid medication but it's not on medication list and he doesn't have bottle   Requested Prescriptions  Pending Prescriptions Disp Refills   losartan (COZAAR) 50 MG tablet 90 tablet 1    Sig: Take 1 tablet (50 mg total) by mouth daily.      Cardiovascular:  Angiotensin Receptor Blockers Failed - 11/30/2019  2:33 PM      Failed - Last BP in normal range    BP Readings from Last 1 Encounters:  10/19/19 (!) 139/94          Passed - Cr in normal range and within 180 days    Creatinine  Date Value Ref Range Status  02/29/2012 1.11 0.60 - 1.30 mg/dL Final   Creatinine, Ser  Date Value Ref Range Status  10/19/2019 0.87 0.76 - 1.27 mg/dL Final          Passed - K in normal range and within 180 days    Potassium  Date Value Ref Range Status  10/19/2019 4.6 3.5 - 5.2 mmol/L Final  02/29/2012 4.3 3.5 - 5.1 mmol/L Final          Passed - Patient is not pregnant      Passed - Valid encounter within last 6 months    Recent Outpatient Visits           3 weeks ago Cough   [facility] Gabriel Cirri, NP   1 month ago Essential hypertension   [facility] Particia Nearing, [place]   10 months ago Essential hypertension   [facility] Roosvelt Maser [place], [place]   1 year ago Essential hypertension   [facility] Roosvelt Maser [place], [place]   1 year ago Seasonal allergic rhinitis due to other allergic trigger   [facility] Particia Nearing, [place]       Future Appointments             In 4 months Cannady, Dorie Rank, NP [employer], [facility]

## 2019-11-30 NOTE — Telephone Encounter (Signed)
Medication Refill - Medication: Fluid medication( patient states he doesn't have the bottle anymore) & Losartan  Has the patient contacted their pharmacy? Yes.   NO refills left (Agent: If no, request that the patient contact the pharmacy for the refill.) (Agent: If yes, when and what did the pharmacy advise?)  Preferred Pharmacy (with phone number or street name): [facility] [address]  Agent: Please be advised that RX refills may take up to 3 business days. We ask that you follow-up with your pharmacy.

## 2019-12-15 DIAGNOSIS — G629 Polyneuropathy, unspecified: Secondary | ICD-10-CM | POA: Diagnosis not present

## 2019-12-15 DIAGNOSIS — Z79899 Other long term (current) drug therapy: Secondary | ICD-10-CM | POA: Diagnosis not present

## 2019-12-15 DIAGNOSIS — M25511 Pain in right shoulder: Secondary | ICD-10-CM | POA: Diagnosis not present

## 2019-12-15 DIAGNOSIS — S48012D Complete traumatic amputation at left shoulder joint, subsequent encounter: Secondary | ICD-10-CM | POA: Diagnosis not present

## 2019-12-15 DIAGNOSIS — M62838 Other muscle spasm: Secondary | ICD-10-CM | POA: Diagnosis not present

## 2020-01-09 ENCOUNTER — Telehealth: Payer: Self-pay

## 2020-01-09 ENCOUNTER — Telehealth: Payer: Self-pay | Admitting: Family Medicine

## 2020-01-09 ENCOUNTER — Ambulatory Visit: Payer: Medicare Other

## 2020-01-09 NOTE — Telephone Encounter (Signed)
This nurse called patient in order to perform scheduled telephonic AWV. Patient was not interested in doing the visit. May do it at another time.

## 2020-01-09 NOTE — Telephone Encounter (Signed)
Per Melvin Brown patient decided he did not want AWV - Declined No reason given

## 2020-01-16 DIAGNOSIS — M25511 Pain in right shoulder: Secondary | ICD-10-CM | POA: Diagnosis not present

## 2020-01-16 DIAGNOSIS — M62838 Other muscle spasm: Secondary | ICD-10-CM | POA: Diagnosis not present

## 2020-01-16 DIAGNOSIS — G629 Polyneuropathy, unspecified: Secondary | ICD-10-CM | POA: Diagnosis not present

## 2020-01-16 DIAGNOSIS — M129 Arthropathy, unspecified: Secondary | ICD-10-CM | POA: Diagnosis not present

## 2020-01-16 DIAGNOSIS — Z79899 Other long term (current) drug therapy: Secondary | ICD-10-CM | POA: Diagnosis not present

## 2020-01-16 DIAGNOSIS — E559 Vitamin D deficiency, unspecified: Secondary | ICD-10-CM | POA: Diagnosis not present

## 2020-02-15 DIAGNOSIS — G629 Polyneuropathy, unspecified: Secondary | ICD-10-CM | POA: Diagnosis not present

## 2020-02-15 DIAGNOSIS — M62838 Other muscle spasm: Secondary | ICD-10-CM | POA: Diagnosis not present

## 2020-02-15 DIAGNOSIS — M5442 Lumbago with sciatica, left side: Secondary | ICD-10-CM | POA: Diagnosis not present

## 2020-02-15 DIAGNOSIS — M25511 Pain in right shoulder: Secondary | ICD-10-CM | POA: Diagnosis not present

## 2020-02-15 DIAGNOSIS — Z79899 Other long term (current) drug therapy: Secondary | ICD-10-CM | POA: Diagnosis not present

## 2020-03-03 ENCOUNTER — Other Ambulatory Visit: Payer: Self-pay | Admitting: Family Medicine

## 2020-03-09 ENCOUNTER — Ambulatory Visit: Payer: Self-pay

## 2020-03-09 ENCOUNTER — Telehealth: Payer: Self-pay | Admitting: Family Medicine

## 2020-03-09 NOTE — Telephone Encounter (Signed)
Called lisa pt's mom since [place] didn't answer. She reached out to get him to answer the phone. When I called Juddson's phone his girlfriend Diane answered and said that pt can not talk he is having a difficult time breathing and trouble walking. Spoke with Dr Laural Benes verbally she advised that the pt needs to go to the ER. Advised Diane of Dr Henriette Combs recommendation, she states that the pt will not go and that she has been trying to get him to go for 3 days. Advised her that she may need to call the ambulance. She states that she will try her best.

## 2020-03-09 NOTE — Telephone Encounter (Signed)
Mother is calling back- she is not with patient- girlfriend reported patient had false + COVID test- advised I was not aware that was a possible reading on test- if it is + COVID- advised per + COVID protocol:   Patient advised to treat symptoms as needed OTC, contact PCP for follow up and go to ED for trouble breathing ,dehydration or severe weakness. Advised to isolate and safe precautions in the home reviewed. CDC criteria for ending isolation given. Mother is concerned about patient wellbeing- breathing - fatigue- advised patient have virtual appointment- if she feels in danger call 911/EMS. She understands.

## 2020-03-09 NOTE — Telephone Encounter (Signed)
Admin: Please schedule patient for a COVID Swab

## 2020-03-09 NOTE — Telephone Encounter (Signed)
Pt took an at home covid test and it came back as a false positive / Pts mom wants to know if it could the flu and what can he take flu / please advise

## 2020-03-09 NOTE — Telephone Encounter (Signed)
Called pt's mother to get more information. Pt took a covid test from [employer] and it said that it was a false positive. Pt's mother is wanting to know if this is possible says that she has tried to call [facility] but she wasn'r able to get thorough. Offered virtual appt, don't know if this will be too late. Please advise.

## 2020-03-09 NOTE — Telephone Encounter (Signed)
Needs to go to ER as below.

## 2020-03-09 NOTE — Telephone Encounter (Signed)
Patient's mother Perfecto Purdy is calling because the patient has a very bad cold. He has a fever. Unsure if it is COVID. The Office does not have any appt for virtual appt. Mother is wanting advice on where the patient can go for medical treatment. Should he go to UC? Should she call an ambulance? Pt.'s mother states he started having symptoms this week. Fever, cough, congestion. No availability in the practice today. Pt. Will go to UC. Reason for Disposition . [1] Fever > 100.0 F (37.8 C) AND [2] bedridden (e.g., nursing home patient, CVA, chronic illness, recovering from surgery)  Answer Assessment - Initial Assessment Questions 1. COVID-19 DIAGNOSIS: "Who made your Coronavirus (COVID-19) diagnosis?" "Was it confirmed by a positive lab test?" If not diagnosed by a HCP, ask "Are there lots of cases (community spread) where you live?" (See public health department website, if unsure)     No 2. COVID-19 EXPOSURE: "Was there any known exposure to COVID before the symptoms began?" CDC Definition of close contact: within 6 feet (2 meters) for a total of 15 minutes or more over a 24-hour period.      No 3. ONSET: "When did the COVID-19 symptoms start?"      This week 4. WORST SYMPTOM: "What is your worst symptom?" (e.g., cough, fever, shortness of breath, muscle aches)     Fever, shortness of breath 5. COUGH: "Do you have a cough?" If Yes, ask: "How bad is the cough?"       Yes 6. FEVER: "Do you have a fever?" If Yes, ask: "What is your temperature, how was it measured, and when did it start?"     Yes 7. RESPIRATORY STATUS: "Describe your breathing?" (e.g., shortness of breath, wheezing, unable to speak)      No 8. BETTER-SAME-WORSE: "Are you getting better, staying the same or getting worse compared to yesterday?"  If getting worse, ask, "In what way?"     Same 9. HIGH RISK DISEASE: "Do you have any chronic medical problems?" (e.g., asthma, heart or lung disease, weak immune system, obesity, etc.)      Asthma 10. PREGNANCY: "Is there any chance you are pregnant?" "When was your last menstrual period?"       n/a 11. OTHER SYMPTOMS: "Do you have any other symptoms?"  (e.g., chills, fatigue, headache, loss of smell or taste, muscle pain, sore throat; new loss of smell or taste especially support the diagnosis of COVID-19)       Cough, congestion, fever  Protocols used: CORONAVIRUS (COVID-19) DIAGNOSED OR SUSPECTED-A-AH

## 2020-03-09 NOTE — Telephone Encounter (Signed)
Summary: flu question   pts mom called back and stated that the pt took an at home covid test and it came back as a false positive/ he thinks he may have the flu and wanted to ask what he can take for the flu / please advise  ----- Message from Valora Piccolo sent at      Attempted to call mother- busy signal

## 2020-03-09 NOTE — Telephone Encounter (Signed)
There is no way they can know it's a false positive. Please have him come get covid swab here.

## 2020-03-10 ENCOUNTER — Emergency Department
Admission: EM | Admit: 2020-03-10 | Discharge: 2020-04-07 | Disposition: E | Payer: Medicare Other | Attending: Emergency Medicine | Admitting: Emergency Medicine

## 2020-03-10 ENCOUNTER — Telehealth: Payer: Self-pay | Admitting: Family Medicine

## 2020-03-10 DIAGNOSIS — R092 Respiratory arrest: Secondary | ICD-10-CM

## 2020-03-10 DIAGNOSIS — I469 Cardiac arrest, cause unspecified: Secondary | ICD-10-CM | POA: Diagnosis not present

## 2020-03-10 DIAGNOSIS — R404 Transient alteration of awareness: Secondary | ICD-10-CM | POA: Diagnosis not present

## 2020-03-10 DIAGNOSIS — J45909 Unspecified asthma, uncomplicated: Secondary | ICD-10-CM

## 2020-03-10 DIAGNOSIS — Z743 Need for continuous supervision: Secondary | ICD-10-CM | POA: Diagnosis not present

## 2020-03-10 DIAGNOSIS — R6889 Other general symptoms and signs: Secondary | ICD-10-CM | POA: Diagnosis not present

## 2020-03-10 DIAGNOSIS — U071 COVID-19: Secondary | ICD-10-CM

## 2020-03-10 DIAGNOSIS — R0602 Shortness of breath: Secondary | ICD-10-CM | POA: Diagnosis present

## 2020-03-10 DIAGNOSIS — I499 Cardiac arrhythmia, unspecified: Secondary | ICD-10-CM | POA: Diagnosis not present

## 2020-03-10 DIAGNOSIS — R52 Pain, unspecified: Secondary | ICD-10-CM | POA: Diagnosis not present

## 2020-03-10 LAB — RESP PANEL BY RT-PCR (FLU A&B, COVID) ARPGX2
Influenza A by PCR: NEGATIVE
Influenza B by PCR: NEGATIVE
SARS Coronavirus 2 by RT PCR: POSITIVE — AB

## 2020-03-13 LAB — CBG MONITORING, ED: Glucose-Capillary: 116 mg/dL — ABNORMAL HIGH (ref 70–99)

## 2020-03-20 MED FILL — Medication: Qty: 1 | Status: AC

## 2020-03-21 IMAGING — CT CT CERVICAL SPINE W/O CM
2 of 14 series · 4 of 33 positions shown, 5 images · non-contrast
Comparison: None.

CLINICAL DATA: 41-year-old male with fall.

EXAM:
CT HEAD WITHOUT CONTRAST
CT MAXILLOFACIAL WITHOUT CONTRAST
CT CERVICAL SPINE WITHOUT CONTRAST
TECHNIQUE: Multidetector CT imaging of the head, cervical spine, and
maxillofacial structures were performed using the standard protocol
without intravenous contrast. Multiplanar CT image reconstructions
of the cervical spine and maxillofacial structures were also
generated.

[Series 12: orthogonal axials · axial · 0.23mm/px · z∈[-499,-320]mm · 3 of 105 slices shown, 4 images]
[im 1/105  soft-tissue]
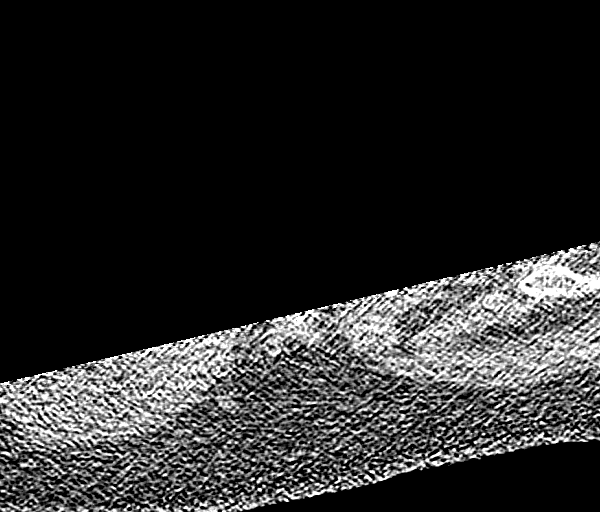
[im 1/105  bone]
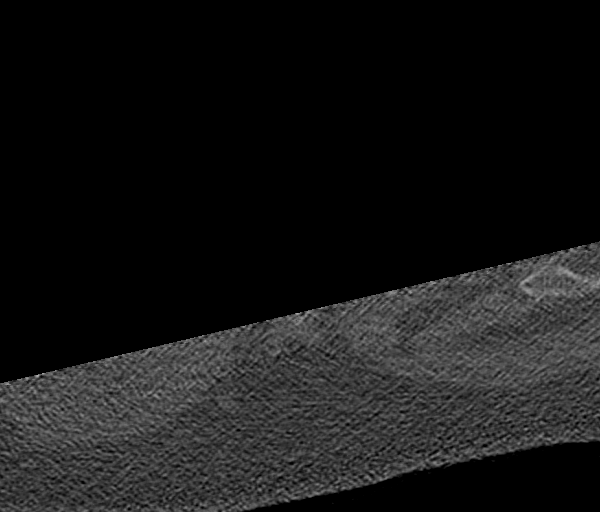
[im 53/105  bone]
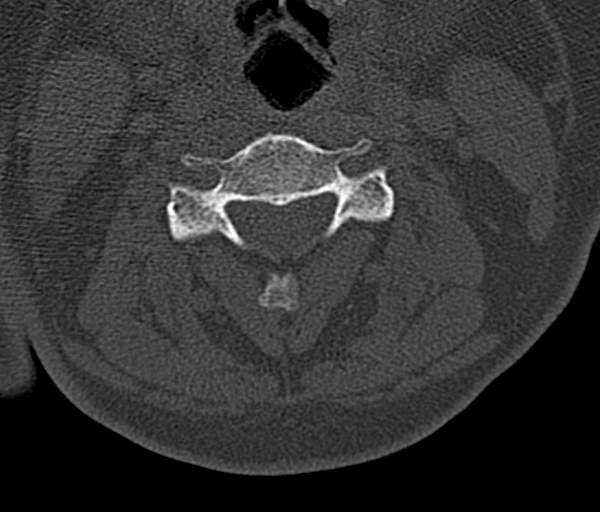
[im 105/105  bone]
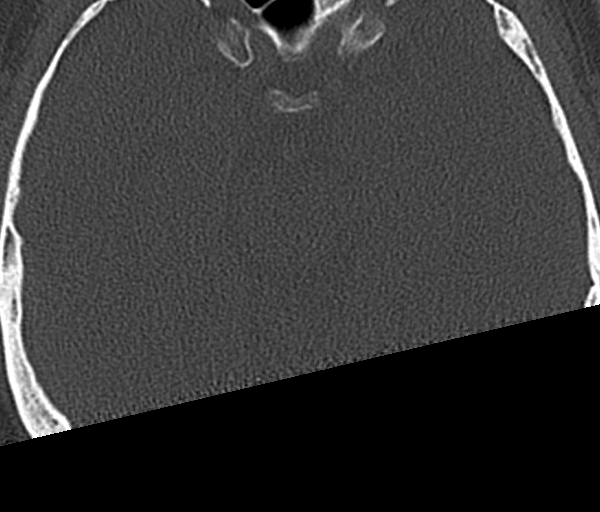

[Series 19: sagittal bone · sagittal · 0.22mm/px · 1 of 70 slices shown]
[im 35/70  bone]
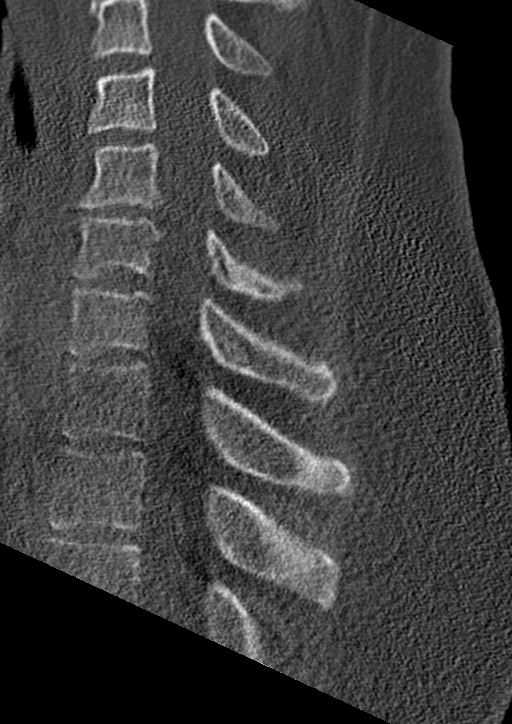

[4 of 33 positions shown; findings below may reference images not displayed]

FINDINGS: CT HEAD FINDINGS

Brain: There is an area of old infarct and encephalomalacia in the
right frontal lobe. There is no acute intracranial hemorrhage. No
mass effect or midline shift. No extra-axial fluid collection.

Vascular: No hyperdense vessel or unexpected calcification.

Skull: Normal. Negative for fracture or focal lesion.

Other: None

CT MAXILLOFACIAL FINDINGS

Osseous: There is slight step-off of the left nasal bone without
overlying soft tissue swelling. This likely represents an old
fracture and less likely an acute fracture. Correlation with
clinical exam and point tenderness recommended. No definite acute
fracture.

Orbits: Negative. No traumatic or inflammatory finding.

Sinuses: There is mild mucoperiosteal thickening of paranasal
sinuses. No air-fluid levels. The mastoid air cells are clear.

Soft tissues: Negative.

CT CERVICAL SPINE FINDINGS

Alignment: No acute subluxation.

Skull base and vertebrae: No acute fracture.

Soft tissues and spinal canal: No prevertebral fluid or swelling. No
visible canal hematoma.

Disc levels: Mild multilevel degenerative changes with disc space
narrowing and endplate irregularity.

Upper chest: Negative.

Other: None
IMPRESSION: 1. No acute intracranial hemorrhage.
2. Focal area of old infarct and encephalomalacia in the right
frontal lobe.
3. Minimally depressed fracture of the left nasal bone, likely
chronic. Clinical correlation is recommended. No other acute facial
bone fracture identified.
4. No acute/traumatic cervical spine pathology.

## 2020-04-07 NOTE — Telephone Encounter (Signed)
Called by nurse manager at [facility] ER. Called EMS last night with SOB, became SOB and bradycardic, came to ER CPR in process. Were unable to get him back. Asked if we would fill out the death certificate. Will fill it out when it comes in.

## 2020-04-07 NOTE — ED Notes (Signed)
Pt intubated. 7.5 ETT 23 cm to lip. (+) color change on colormetric. Good equal breath sounds.

## 2020-04-07 NOTE — ED Notes (Signed)
BS 116

## 2020-04-07 NOTE — ED Notes (Signed)
1 mg Epinephrine and Sodium Bicarb IVP given

## 2020-04-07 NOTE — ED Notes (Signed)
Pulse check. No pulses. Asystole on the monitor. 1 mg Epinephrine given IVP. CPR continued.

## 2020-04-07 NOTE — ED Notes (Signed)
Pulse check No pulses. Asystole on the monitor. 1 Mg Epinephrine given IVP. CPR continued.
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
## 2020-04-07 NOTE — ED Notes (Addendum)
Pulse check- no pulse Asystole on monitor. Epi 1 mg IVP, Sodium Bicarb IVP. CPR continued.

## 2020-04-07 NOTE — ED Notes (Signed)
Pulse check. No pulses and Asystole on monitor. CPR continued.

## 2020-04-07 NOTE — ED Notes (Addendum)
Pulse check- No pulses. Asystole on monitor. 1 mg Epinephrine IVP. CPR continued.

## 2020-04-07 NOTE — ED Notes (Signed)
EMS to Rm 16

## 2020-04-07 NOTE — ED Notes (Signed)
20 g IV started left ankle

## 2020-04-07 NOTE — ED Notes (Signed)
Pulse check. No pulses, Asystole on monitor. 1 mg Epinephrine given IVP. CPR continued.

## 2020-04-07 NOTE — ED Notes (Signed)
20 g angiocath started right forearm by [place]

## 2020-04-07 NOTE — ED Notes (Signed)
Pulse check. No pulses. Asystole on monitor on 2 leads. No cardiac activity per [place].  TOD 0236

## 2020-04-07 NOTE — ED Notes (Signed)
Pulse check. No pulses and Asystole on the monitor. 1 mg Epinephrine given IVP. CPR continued.

## 2020-04-07 NOTE — Progress Notes (Signed)
Chaplain engaged in visit with Ethanael's fiance, Diane.  Chaplain provided support as doctor and nurse shared with Diane the death of Josejulian.  Chaplain assisted Diane in seeing Mister and getting in touch with family.  During time together, Diane voiced that she was not feeling well and that she had not been doing well for at least a week.  Physician and chaplain asked about the possibility of COVID.  Diane later voiced that she was feeling weak and light-headed.  Chaplain asked if she would like to be admitted and she verbalized, "Yes."  Chaplain worked with Diane to be admitted and continued to offer support until Diane needed to have X-Ray and blood work completed.  Nurses and staff verbalized that they would let Diane know of Alvan's positive COVID test and figure out the best and appropriate measures concerning her waiting in Shamus's room or going into the ED waiting area.   Chaplain continued to offer support and will follow-up as needed.    2020/03/13 0400  Clinical Encounter Type  Visited With Family  Visit Type Initial;Death  Stress Factors  Family Stress Factors Health changes;Loss;Major life changes;Exhausted

## 2020-04-07 NOTE — ED Notes (Signed)
Pulse check. No pulses, asystole on monitor. CPR continued.

## 2020-04-07 NOTE — ED Notes (Addendum)
EMS arrived CPR in progress. King airway in place. IO left tibia.  Reported call for respiratory distress x 2 days. Pt went into a bradycardia then cardiac arrest for EMS.

## 2020-04-07 DEATH — deceased

## 2020-04-25 ENCOUNTER — Ambulatory Visit: Payer: Medicare Other | Admitting: Family Medicine

## 2020-04-25 ENCOUNTER — Ambulatory Visit: Payer: Medicare Other | Admitting: Nurse Practitioner

## 2021-01-09 ENCOUNTER — Ambulatory Visit: Payer: Medicare Other
